# Patient Record
Sex: Female | Born: 1965 | Race: White | Hispanic: No | Marital: Married | Smoking: Never smoker
Health system: Southern US, Community
[De-identification: ages and names within clinical notes are randomized; demographics above are authoritative.]

## PROBLEM LIST (undated history)

## (undated) DIAGNOSIS — E079 Disorder of thyroid, unspecified: Secondary | ICD-10-CM

## (undated) DIAGNOSIS — C73 Malignant neoplasm of thyroid gland: Secondary | ICD-10-CM

## (undated) DIAGNOSIS — K219 Gastro-esophageal reflux disease without esophagitis: Secondary | ICD-10-CM

## (undated) HISTORY — DX: Disorder of thyroid, unspecified: E07.9

## (undated) HISTORY — PX: EYE SURGERY: SHX253

## (undated) HISTORY — DX: Malignant neoplasm of thyroid gland: C73

## (undated) HISTORY — DX: Gastro-esophageal reflux disease without esophagitis: K21.9

---

## 1986-03-29 HISTORY — PX: BREAST SURGERY: SHX581

## 2001-06-10 ENCOUNTER — Encounter: Admission: RE | Admit: 2001-06-10 | Discharge: 2001-09-08 | Payer: Self-pay | Admitting: Internal Medicine

## 2001-07-07 ENCOUNTER — Other Ambulatory Visit: Admission: RE | Admit: 2001-07-07 | Discharge: 2001-07-07 | Payer: Self-pay | Admitting: *Deleted

## 2002-10-29 HISTORY — PX: WISDOM TOOTH EXTRACTION: SHX21

## 2003-06-03 ENCOUNTER — Encounter: Payer: Self-pay | Admitting: Internal Medicine

## 2003-06-03 ENCOUNTER — Ambulatory Visit (HOSPITAL_COMMUNITY): Admission: RE | Admit: 2003-06-03 | Discharge: 2003-06-03 | Payer: Self-pay | Admitting: Internal Medicine

## 2007-03-17 ENCOUNTER — Encounter: Admission: RE | Admit: 2007-03-17 | Discharge: 2007-03-17 | Payer: Self-pay | Admitting: Obstetrics and Gynecology

## 2012-08-01 ENCOUNTER — Ambulatory Visit (INDEPENDENT_AMBULATORY_CARE_PROVIDER_SITE_OTHER): Payer: Managed Care, Other (non HMO) | Admitting: Surgery

## 2012-08-01 ENCOUNTER — Encounter (INDEPENDENT_AMBULATORY_CARE_PROVIDER_SITE_OTHER): Payer: Self-pay | Admitting: Surgery

## 2012-08-01 VITALS — BP 126/80 | HR 68 | Temp 99.0°F | Resp 18 | Ht 69.0 in | Wt 197.6 lb

## 2012-08-01 DIAGNOSIS — D1720 Benign lipomatous neoplasm of skin and subcutaneous tissue of unspecified limb: Secondary | ICD-10-CM

## 2012-08-01 DIAGNOSIS — D1779 Benign lipomatous neoplasm of other sites: Secondary | ICD-10-CM

## 2012-08-01 NOTE — Patient Instructions (Signed)
We will schedule outpatient surgery to remove the lipoma from your left thigh

## 2012-08-01 NOTE — Progress Notes (Signed)
NAMEJOIA Acevedo DOB: May 20, 1966 MRN: [id number]                                                                                      DATE: 08/01/2012  PCP: Carylon Perches, MD Referring Provider: Carylon Perches, MD  IMPRESSION:  6 cm lipoma left inner thigh, symptomatic 4 cm lipoma, asymptomatic, right supraclavicular area  PLAN:  The patient desires excision of the symptomatic thigh lipoma.since the one in in the right supra-clavicular areas asymptomatic she does not want [place] to remove this at this point in time.                 CC:  Chief Complaint  Patient presents with  . Lipoma    thigh    HPI:  Claudia Acevedo is a 46 y.o.  female who presents for evaluation of a lipoma in the left inner thigh. He has been present for about 3 years. Slowly enlarging. It is somewhat uncomfortable. She doesn't feel has a similar feeling mass in the right supraclavicular area. However this been present for a longer period, causes her no discomfort, and does not appear to be enlarging as for her she can tell.  PMH:  has no past medical history on file.  PSH:   has past surgical history that includes Breast surgery (03/1986).  ALLERGIES:  No Known Allergies  MEDICATIONS: No current outpatient prescriptions on file.  ROS: She has filled out our 12 point review of systems and it is negative. EXAM:   VITAL SIGNS: BP 126/80  Pulse 68  Temp 99 F (37.2 C) (Oral)  Resp 18  Ht 5\' 9"  (1.753 m)  Wt 197 lb 9.6 oz (89.631 kg)  BMI 29.18 kg/m2 GENERAL:  The patient is alert, oriented, and generally healthy-appearing, NAD. Mood and affect are normal.  HEENT:  The head is normocephalic, the eyes nonicteric, the pupils were round regular and equal. EOMs are normal. Pharynx normal. Dentition good.  NECK:  The neck is supple and there are no masses or thyromegaly.there is a 4 cm well-circumscribed mobile soft lipoma-type mass in the right supraclavicular area.  LUNGS: Normal respirations and clear to  auscultation.  HEART: Regular rhythm, with no murmurs rubs or gallops. Pulses are intact carotid dorsalis pedis and posterior tibial. No significant varicosities are noted.  ABDOMEN: Soft, flat, and nontender. No masses or organomegaly is noted. No hernias are noted. Bowel sounds are normal.  EXTREMITIES:  Good range of motion, no edema.there is a 6.5 x 4.5 cm soft mobile protruding mass in the left upper inner thigh. It is nontender.     Claudia Acevedo 08/01/2012  CC: Carylon Perches, MD, Carylon Perches, MD

## 2012-09-10 ENCOUNTER — Ambulatory Visit (HOSPITAL_BASED_OUTPATIENT_CLINIC_OR_DEPARTMENT_OTHER): Admission: RE | Admit: 2012-09-10 | Payer: Managed Care, Other (non HMO) | Source: Ambulatory Visit | Admitting: Surgery

## 2012-09-10 ENCOUNTER — Encounter (HOSPITAL_BASED_OUTPATIENT_CLINIC_OR_DEPARTMENT_OTHER): Admission: RE | Payer: Self-pay | Source: Ambulatory Visit

## 2012-09-10 SURGERY — EXCISION LIPOMA
Anesthesia: General | Laterality: Left

## 2012-10-01 ENCOUNTER — Encounter (INDEPENDENT_AMBULATORY_CARE_PROVIDER_SITE_OTHER): Payer: Managed Care, Other (non HMO) | Admitting: Surgery

## 2013-10-06 ENCOUNTER — Ambulatory Visit (INDEPENDENT_AMBULATORY_CARE_PROVIDER_SITE_OTHER): Payer: Managed Care, Other (non HMO) | Admitting: General Surgery

## 2013-10-06 ENCOUNTER — Encounter (INDEPENDENT_AMBULATORY_CARE_PROVIDER_SITE_OTHER): Payer: Self-pay

## 2013-10-06 ENCOUNTER — Encounter (INDEPENDENT_AMBULATORY_CARE_PROVIDER_SITE_OTHER): Payer: Self-pay | Admitting: General Surgery

## 2013-10-06 VITALS — BP 126/88 | HR 68 | Temp 97.9°F | Resp 16 | Ht 69.0 in | Wt 205.0 lb

## 2013-10-06 DIAGNOSIS — D1720 Benign lipomatous neoplasm of skin and subcutaneous tissue of unspecified limb: Secondary | ICD-10-CM

## 2013-10-06 DIAGNOSIS — D1779 Benign lipomatous neoplasm of other sites: Secondary | ICD-10-CM

## 2013-10-06 NOTE — Patient Instructions (Signed)
Plan for excision lipoma left thigh

## 2013-10-06 NOTE — Progress Notes (Signed)
Patient ID: Claudia Acevedo, female   DOB: September 23, 1966, 47 y.o.   MRN: [id number]  Chief Complaint  Patient presents with  . Lipoma    thigh    HPI Claudia Acevedo is a 47 y.o. female.  The patient is a 47 year old white female who states that she has had a lipoma on her left inner thigh for the last 5-6 years. She saw Dr. Jamey Ripa for this last year but was unable to schedule. Since that time it has not gotten much larger. It does cause her some irritation and causes her clothes not fit well.  HPI  Past Medical History  Diagnosis Date  . GERD (gastroesophageal reflux disease)     Past Surgical History  Procedure Laterality Date  . Breast surgery  03/1986    lft fib removed from left breast  . Wisdom tooth extraction  2004    Family History  Problem Relation Age of Onset  . Hyperlipidemia Mother   . Hypertension Mother   . ALS Father     Social History History  Substance Use Topics  . Smoking status: Never Smoker   . Smokeless tobacco: Not on file  . Alcohol Use: Yes     Comment: twice month    No Known Allergies  No current outpatient prescriptions on file.   No current facility-administered medications for this visit.    Review of Systems Review of Systems  Constitutional: Negative.   HENT: Negative.   Eyes: Negative.   Respiratory: Negative.   Cardiovascular: Negative.   Gastrointestinal: Negative.   Endocrine: Negative.   Genitourinary: Negative.   Musculoskeletal: Negative.   Skin: Negative.   Allergic/Immunologic: Negative.   Neurological: Negative.   Hematological: Negative.   Psychiatric/Behavioral: Negative.     Blood pressure 126/88, pulse 68, temperature 97.9 F (36.6 C), temperature source Temporal, resp. rate 16, height 5\' 9"  (1.753 m), weight 205 lb (92.987 kg).  Physical Exam Physical Exam  Constitutional: She is oriented to person, place, and time. She appears well-developed and well-nourished.  HENT:  Head: Normocephalic and atraumatic.   Eyes: Conjunctivae and EOM are normal. Pupils are equal, round, and reactive to light.  Neck: Normal range of motion. Neck supple.  There is a large lipoma in the right supraclavicular area  Cardiovascular: Normal rate, regular rhythm and normal heart sounds.   Pulmonary/Chest: Effort normal and breath sounds normal.  Abdominal: Soft. Bowel sounds are normal.  Musculoskeletal: Normal range of motion.  There is a large lipoma on the left upper inner thigh measuring approximate 7 cm across  Neurological: She is alert and oriented to person, place, and time.  Skin: Skin is warm and dry.  Psychiatric: She has a normal mood and affect. Her behavior is normal.    Data Reviewed As above  Assessment    The patient has a large lipoma on her left upper inner thigh that causes her some discomfort. Because of this I think it would be reasonable to remove it. She would also like to have this done. I discussed with her in detail the risks and benefits of the operation to remove the lipoma as well as some of the technical aspects and she understands and wishes to proceed     Plan    Plan for excision of the left thigh lipoma        TOTH III,Curt Oatis S 10/06/2013, 9:31 AM

## 2013-10-13 ENCOUNTER — Encounter (HOSPITAL_BASED_OUTPATIENT_CLINIC_OR_DEPARTMENT_OTHER): Payer: Self-pay | Admitting: *Deleted

## 2013-10-19 ENCOUNTER — Encounter (HOSPITAL_BASED_OUTPATIENT_CLINIC_OR_DEPARTMENT_OTHER): Payer: Self-pay | Admitting: *Deleted

## 2013-10-19 ENCOUNTER — Ambulatory Visit (HOSPITAL_BASED_OUTPATIENT_CLINIC_OR_DEPARTMENT_OTHER)
Admission: RE | Admit: 2013-10-19 | Discharge: 2013-10-19 | Disposition: A | Discharge status: Home / Self Care | Payer: Managed Care, Other (non HMO) | Source: Ambulatory Visit | Attending: General Surgery | Admitting: General Surgery

## 2013-10-19 ENCOUNTER — Ambulatory Visit (HOSPITAL_BASED_OUTPATIENT_CLINIC_OR_DEPARTMENT_OTHER): Payer: Managed Care, Other (non HMO) | Admitting: *Deleted

## 2013-10-19 ENCOUNTER — Encounter (HOSPITAL_BASED_OUTPATIENT_CLINIC_OR_DEPARTMENT_OTHER): Payer: Managed Care, Other (non HMO) | Admitting: *Deleted

## 2013-10-19 ENCOUNTER — Encounter (HOSPITAL_BASED_OUTPATIENT_CLINIC_OR_DEPARTMENT_OTHER)
Admission: RE | Disposition: A | Discharge status: Home / Self Care | Payer: Self-pay | Source: Ambulatory Visit | Attending: General Surgery

## 2013-10-19 DIAGNOSIS — K219 Gastro-esophageal reflux disease without esophagitis: Secondary | ICD-10-CM | POA: Insufficient documentation

## 2013-10-19 DIAGNOSIS — D1739 Benign lipomatous neoplasm of skin and subcutaneous tissue of other sites: Secondary | ICD-10-CM

## 2013-10-19 DIAGNOSIS — D1720 Benign lipomatous neoplasm of skin and subcutaneous tissue of unspecified limb: Secondary | ICD-10-CM

## 2013-10-19 DIAGNOSIS — D1779 Benign lipomatous neoplasm of other sites: Secondary | ICD-10-CM | POA: Insufficient documentation

## 2013-10-19 HISTORY — PX: LIPOMA EXCISION: SHX5283

## 2013-10-19 SURGERY — EXCISION LIPOMA
Anesthesia: General | Site: Thigh | Laterality: Left

## 2013-10-19 MED ORDER — BUPIVACAINE HCL (PF) 0.25 % IJ SOLN
INTRAMUSCULAR | Status: DC | PRN
  Administered 2013-10-19: 16 mL

## 2013-10-19 MED ORDER — MIDAZOLAM HCL 5 MG/5ML IJ SOLN
INTRAMUSCULAR | Status: DC | PRN
  Administered 2013-10-19: 2 mg via INTRAVENOUS

## 2013-10-19 MED ORDER — LIDOCAINE HCL (CARDIAC) 20 MG/ML IV SOLN
INTRAVENOUS | Status: DC | PRN
  Administered 2013-10-19: 60 mg via INTRAVENOUS

## 2013-10-19 MED ORDER — FENTANYL CITRATE 0.05 MG/ML IJ SOLN: 50.0000 ug | Freq: Once | INTRAMUSCULAR | Status: DC

## 2013-10-19 MED ORDER — OXYCODONE HCL 5 MG/5ML PO SOLN: 5.0000 mg | Freq: Once | ORAL | Status: DC | PRN

## 2013-10-19 MED ORDER — BUPIVACAINE HCL (PF) 0.25 % IJ SOLN
INTRAMUSCULAR | Status: AC
  Filled 2013-10-19: qty 30

## 2013-10-19 MED ORDER — MIDAZOLAM HCL 2 MG/2ML IJ SOLN: 1.0000 mg | INTRAMUSCULAR | Status: DC | PRN

## 2013-10-19 MED ORDER — FENTANYL CITRATE 0.05 MG/ML IJ SOLN
INTRAMUSCULAR | Status: DC | PRN
  Administered 2013-10-19: 100 ug via INTRAVENOUS

## 2013-10-19 MED ORDER — PROMETHAZINE HCL 25 MG/ML IJ SOLN: 6.2500 mg | INTRAMUSCULAR | Status: DC | PRN

## 2013-10-19 MED ORDER — OXYCODONE HCL 5 MG PO TABS: 5.0000 mg | ORAL_TABLET | Freq: Once | ORAL | Status: DC | PRN

## 2013-10-19 MED ORDER — MIDAZOLAM HCL 2 MG/2ML IJ SOLN
INTRAMUSCULAR | Status: AC
  Filled 2013-10-19: qty 2

## 2013-10-19 MED ORDER — BUPIVACAINE-EPINEPHRINE PF 0.25-1:200000 % IJ SOLN
INTRAMUSCULAR | Status: AC
  Filled 2013-10-19: qty 30

## 2013-10-19 MED ORDER — HYDROCODONE-IBUPROFEN 5-200 MG PO TABS: 1.0000 | ORAL_TABLET | Freq: Three times a day (TID) | ORAL | Status: DC | PRN

## 2013-10-19 MED ORDER — SUCCINYLCHOLINE CHLORIDE 20 MG/ML IJ SOLN
INTRAMUSCULAR | Status: AC
  Filled 2013-10-19: qty 1

## 2013-10-19 MED ORDER — FENTANYL CITRATE 0.05 MG/ML IJ SOLN
INTRAMUSCULAR | Status: AC
  Filled 2013-10-19: qty 4

## 2013-10-19 MED ORDER — PROPOFOL 10 MG/ML IV BOLUS
INTRAVENOUS | Status: DC | PRN
  Administered 2013-10-19: 180 mg via INTRAVENOUS

## 2013-10-19 MED ORDER — HYDROMORPHONE HCL PF 1 MG/ML IJ SOLN: 0.2500 mg | INTRAMUSCULAR | Status: DC | PRN

## 2013-10-19 MED ORDER — FENTANYL CITRATE 0.05 MG/ML IJ SOLN: 50.0000 ug | INTRAMUSCULAR | Status: DC | PRN

## 2013-10-19 MED ORDER — LACTATED RINGERS IV SOLN
INTRAVENOUS | Status: DC
  Administered 2013-10-19 (×2): via INTRAVENOUS

## 2013-10-19 MED ORDER — DEXAMETHASONE SODIUM PHOSPHATE 4 MG/ML IJ SOLN
INTRAMUSCULAR | Status: DC | PRN
  Administered 2013-10-19: 10 mg via INTRAVENOUS

## 2013-10-19 MED ORDER — PROPOFOL 10 MG/ML IV EMUL
INTRAVENOUS | Status: AC
  Filled 2013-10-19: qty 50

## 2013-10-19 MED ORDER — CEFAZOLIN SODIUM-DEXTROSE 2-3 GM-% IV SOLR
INTRAVENOUS | Status: DC | PRN
  Administered 2013-10-19: 2 g via INTRAVENOUS

## 2013-10-19 MED ORDER — ONDANSETRON HCL 4 MG/2ML IJ SOLN
INTRAMUSCULAR | Status: DC | PRN
  Administered 2013-10-19: 4 mg via INTRAVENOUS

## 2013-10-19 SURGICAL SUPPLY — 51 items
ADH SKN CLS APL DERMABOND .7 (GAUZE/BANDAGES/DRESSINGS) ×1
BLADE SURG 10 STRL SS (BLADE) ×1 IMPLANT
BLADE SURG 15 STRL LF DISP TIS (BLADE) ×1 IMPLANT
BLADE SURG 15 STRL SS (BLADE) ×2
CANISTER SUCT 1200ML W/VALVE (MISCELLANEOUS) IMPLANT
CHLORAPREP W/TINT 26ML (MISCELLANEOUS) ×2 IMPLANT
COVER MAYO STAND STRL (DRAPES) ×2 IMPLANT
COVER TABLE BACK 60X90 (DRAPES) ×2 IMPLANT
DECANTER SPIKE VIAL GLASS SM (MISCELLANEOUS) IMPLANT
DERMABOND ADVANCED (GAUZE/BANDAGES/DRESSINGS) ×1
DERMABOND ADVANCED .7 DNX12 (GAUZE/BANDAGES/DRESSINGS) ×1 IMPLANT
DRAPE PED LAPAROTOMY (DRAPES) ×2 IMPLANT
DRAPE UTILITY XL STRL (DRAPES) ×2 IMPLANT
ELECT COATED BLADE 2.86 ST (ELECTRODE) ×2 IMPLANT
ELECT REM PT RETURN 9FT ADLT (ELECTROSURGICAL) ×2
ELECTRODE REM PT RTRN 9FT ADLT (ELECTROSURGICAL) ×1 IMPLANT
GAUZE SPONGE 4X4 16PLY XRAY LF (GAUZE/BANDAGES/DRESSINGS) IMPLANT
GLOVE BIO SURGEON STRL SZ7.5 (GLOVE) ×2 IMPLANT
GLOVE BIOGEL PI IND STRL 7.0 (GLOVE) IMPLANT
GLOVE BIOGEL PI INDICATOR 7.0 (GLOVE) ×1
GLOVE ECLIPSE 6.5 STRL STRAW (GLOVE) ×1 IMPLANT
GOWN PREVENTION PLUS XLARGE (GOWN DISPOSABLE) ×4 IMPLANT
NDL HYPO 25X1 1.5 SAFETY (NEEDLE) IMPLANT
NEEDLE HYPO 25X1 1.5 SAFETY (NEEDLE) ×2 IMPLANT
NS IRRIG 1000ML POUR BTL (IV SOLUTION) ×2 IMPLANT
PACK BASIN DAY SURGERY FS (CUSTOM PROCEDURE TRAY) ×2 IMPLANT
PENCIL BUTTON HOLSTER BLD 10FT (ELECTRODE) ×2 IMPLANT
SLEEVE SCD COMPRESS KNEE MED (MISCELLANEOUS) ×2 IMPLANT
SPONGE GAUZE 4X4 12PLY (GAUZE/BANDAGES/DRESSINGS) ×1 IMPLANT
SPONGE GAUZE 4X4 12PLY STER LF (GAUZE/BANDAGES/DRESSINGS) ×1 IMPLANT
SPONGE LAP 18X18 X RAY DECT (DISPOSABLE) ×1 IMPLANT
SUT CHROMIC 3 0 SH 27 (SUTURE) IMPLANT
SUT ETHILON 3 0 PS 1 (SUTURE) IMPLANT
SUT MNCRL AB 4-0 PS2 18 (SUTURE) ×1 IMPLANT
SUT MON AB 4-0 PC3 18 (SUTURE) IMPLANT
SUT PROLENE 3 0 PS 2 (SUTURE) IMPLANT
SUT SILK 2 0 FS (SUTURE) IMPLANT
SUT VIC AB 3-0 54X BRD REEL (SUTURE) IMPLANT
SUT VIC AB 3-0 BRD 54 (SUTURE)
SUT VIC AB 3-0 FS2 27 (SUTURE) IMPLANT
SUT VIC AB 3-0 SH 27 (SUTURE)
SUT VIC AB 3-0 SH 27X BRD (SUTURE) IMPLANT
SUT VIC AB 4-0 RB1 27 (SUTURE)
SUT VIC AB 4-0 RB1 27X BRD (SUTURE) IMPLANT
SUT VICRYL 3-0 CR8 SH (SUTURE) ×1 IMPLANT
SUT VICRYL AB 3 0 TIES (SUTURE) IMPLANT
SYR CONTROL 10ML LL (SYRINGE) ×1 IMPLANT
TOWEL OR 17X24 6PK STRL BLUE (TOWEL DISPOSABLE) ×3 IMPLANT
TOWEL OR NON WOVEN STRL DISP B (DISPOSABLE) ×1 IMPLANT
TUBE CONNECTING 20X1/4 (TUBING) IMPLANT
YANKAUER SUCT BULB TIP NO VENT (SUCTIONS) IMPLANT

## 2013-10-19 NOTE — Op Note (Signed)
10/19/2013  9:46 AM  PATIENT:  Claudia Acevedo  47 y.o. female  PRE-OPERATIVE DIAGNOSIS:  lipoma left thigh  POST-OPERATIVE DIAGNOSIS:  lipoma left thigh, 11 X 6cm  PROCEDURE:  Procedure(s): EXCISION THIGH LIPOMA (Left)  SURGEON:  Surgeon(s) and Role:    * Robyne Askew, MD - Primary  PHYSICIAN ASSISTANT:   ASSISTANTS: none   ANESTHESIA:   general  EBL:  Total I/O In: 1250 [I.V.:1250] Out: -   BLOOD ADMINISTERED:none  DRAINS: none   LOCAL MEDICATIONS USED:  MARCAINE     SPECIMEN:  Source of Specimen:  lipoma left thigh  DISPOSITION OF SPECIMEN:  PATHOLOGY  COUNTS:  YES  TOURNIQUET:  * No tourniquets in log *  DICTATION: .Dragon Dictation After informed consent was obtained the patient was brought to the operating room placed in the supine position on the operating table. After adequate induction of general anesthesia the patient was placed in a frog-leg type position and her left upper inner thigh was prepped with ChloraPrep, allowed to dry, and draped in usual sterile manner. The patient had a large lipoma in her inner upper left thigh. An elliptical incision was made around the skin overlying the lipoma. This incision was carried through the skin and subcutaneous tissue sharply with the electrocautery. The lipoma was easily separated from the rest of the subcutaneous tissue by blunt finger dissection as well as sharp dissection with the electrocautery. All finger projections off of the main body of the lipoma were also removed in a similar manner. Once the lipoma was removed it measured 11 x 6 cm. Hemostasis was achieved using the Bovie electrocautery. The wound was infiltrated with quarter percent Marcaine and irrigated with copious amounts of saline. The deep layer the wound was then closed with interrupted 3-0 Vicryl stitches. The skin was then closed with interrupted 4-0 Monocryl subcuticular stitches. Dermabond dressings and sterile dressings were applied. The patient  tolerated the procedure well. At the end of the case all needle sponge and instrument counts were correct. The patient was then awakened and taken to recovery in stable condition.  PLAN OF CARE: Discharge to home after PACU  PATIENT DISPOSITION:  PACU - hemodynamically stable.   Delay start of Pharmacological VTE agent (>24hrs) due to surgical blood loss or risk of bleeding: not applicable

## 2013-10-19 NOTE — Interval H&P Note (Signed)
History and Physical Interval Note:  10/19/2013 8:28 AM  Claudia Acevedo  has presented today for surgery, with the diagnosis of lipoma  The various methods of treatment have been discussed with the patient and family. After consideration of risks, benefits and other options for treatment, the patient has consented to  Procedure(s): EXCISION THIGH LIPOMA (Left) as a surgical intervention .  The patient's history has been reviewed, patient examined, no change in status, stable for surgery.  I have reviewed the patient's chart and labs.  Questions were answered to the patient's satisfaction.     TOTH III,Samreen Seltzer S

## 2013-10-19 NOTE — H&P (View-Only) (Signed)
Patient ID: Claudia Acevedo, female   DOB: 04/27/1966, 47 y.o.   MRN: [id number]  Chief Complaint  Patient presents with  . Lipoma    thigh    HPI Claudia Acevedo is a 47 y.o. female.  The patient is a 47-year-old white female who states that she has had a lipoma on her left inner thigh for the last 5-6 years. She saw Dr. Streck for this last year but was unable to schedule. Since that time it has not gotten much larger. It does cause her some irritation and causes her clothes not fit well.  HPI  Past Medical History  Diagnosis Date  . GERD (gastroesophageal reflux disease)     Past Surgical History  Procedure Laterality Date  . Breast surgery  03/1986    lft fib removed from left breast  . Wisdom tooth extraction  2004    Family History  Problem Relation Age of Onset  . Hyperlipidemia Mother   . Hypertension Mother   . ALS Father     Social History History  Substance Use Topics  . Smoking status: Never Smoker   . Smokeless tobacco: Not on file  . Alcohol Use: Yes     Comment: twice month    No Known Allergies  No current outpatient prescriptions on file.   No current facility-administered medications for this visit.    Review of Systems Review of Systems  Constitutional: Negative.   HENT: Negative.   Eyes: Negative.   Respiratory: Negative.   Cardiovascular: Negative.   Gastrointestinal: Negative.   Endocrine: Negative.   Genitourinary: Negative.   Musculoskeletal: Negative.   Skin: Negative.   Allergic/Immunologic: Negative.   Neurological: Negative.   Hematological: Negative.   Psychiatric/Behavioral: Negative.     Blood pressure 126/88, pulse 68, temperature 97.9 F (36.6 C), temperature source Temporal, resp. rate 16, height 5' 9" (1.753 m), weight 205 lb (92.987 kg).  Physical Exam Physical Exam  Constitutional: She is oriented to person, place, and time. She appears well-developed and well-nourished.  HENT:  Head: Normocephalic and atraumatic.   Eyes: Conjunctivae and EOM are normal. Pupils are equal, round, and reactive to light.  Neck: Normal range of motion. Neck supple.  There is a large lipoma in the right supraclavicular area  Cardiovascular: Normal rate, regular rhythm and normal heart sounds.   Pulmonary/Chest: Effort normal and breath sounds normal.  Abdominal: Soft. Bowel sounds are normal.  Musculoskeletal: Normal range of motion.  There is a large lipoma on the left upper inner thigh measuring approximate 7 cm across  Neurological: She is alert and oriented to person, place, and time.  Skin: Skin is warm and dry.  Psychiatric: She has a normal mood and affect. Her behavior is normal.    Data Reviewed As above  Assessment    The patient has a large lipoma on her left upper inner thigh that causes her some discomfort. Because of this I think it would be reasonable to remove it. She would also like to have this done. I discussed with her in detail the risks and benefits of the operation to remove the lipoma as well as some of the technical aspects and she understands and wishes to proceed     Plan    Plan for excision of the left thigh lipoma        TOTH III,PAUL S 10/06/2013, 9:31 AM    

## 2013-10-19 NOTE — Transfer of Care (Signed)
Immediate Anesthesia Transfer of Care Note  Patient: Claudia Acevedo  Procedure(s) Performed: Procedure(s): EXCISION THIGH LIPOMA (Left)  Patient Location: PACU  Anesthesia Type:General  Level of Consciousness: awake, alert  and oriented  Airway & Oxygen Therapy: Patient Spontanous Breathing and Patient connected to face mask oxygen  Post-op Assessment: Report given to PACU RN, Post -op Vital signs reviewed and stable and Patient moving all extremities  Post vital signs: Reviewed and stable  Complications: No apparent anesthesia complications

## 2013-10-19 NOTE — Anesthesia Procedure Notes (Signed)
Procedure Name: LMA Insertion Date/Time: 10/19/2013 9:04 AM Performed by: Robyne Askew Pre-anesthesia Checklist: Patient identified, Emergency Drugs available, Suction available and Patient being monitored Patient Re-evaluated:Patient Re-evaluated prior to inductionOxygen Delivery Method: Circle System Utilized Preoxygenation: Pre-oxygenation with 100% oxygen Intubation Type: IV induction Ventilation: Mask ventilation without difficulty LMA: LMA inserted LMA Size: 4.0 Number of attempts: 1 Airway Equipment and Method: bite block Placement Confirmation: positive ETCO2 and breath sounds checked- equal and bilateral Tube secured with: Tape Dental Injury: Teeth and Oropharynx as per pre-operative assessment

## 2013-10-19 NOTE — Anesthesia Preprocedure Evaluation (Signed)
Anesthesia Evaluation  Patient identified by MRN, date of birth, ID band Patient awake    Reviewed: Allergy & Precautions, H&P , NPO status , Patient's Chart, lab work & pertinent test results  Airway Mallampati: I TM Distance: >3 FB Neck ROM: Full    Dental   Pulmonary          Cardiovascular Rhythm:Regular Rate:Normal     Neuro/Psych    GI/Hepatic GERD-  ,  Endo/Other    Renal/GU      Musculoskeletal   Abdominal (+) + obese,   Peds  Hematology   Anesthesia Other Findings   Reproductive/Obstetrics                           Anesthesia Physical Anesthesia Plan  ASA: I  Anesthesia Plan: General   Post-op Pain Management:    Induction: Intravenous  Airway Management Planned: LMA  Additional Equipment:   Intra-op Plan:   Post-operative Plan: Extubation in OR  Informed Consent: I have reviewed the patients History and Physical, chart, labs and discussed the procedure including the risks, benefits and alternatives for the proposed anesthesia with the patient or authorized representative who has indicated his/her understanding and acceptance.     Plan Discussed with: CRNA and Surgeon  Anesthesia Plan Comments:         Anesthesia Quick Evaluation

## 2013-10-19 NOTE — Anesthesia Postprocedure Evaluation (Signed)
  Anesthesia Post-op Note  Patient: Claudia Acevedo  Procedure(s) Performed: Procedure(s): EXCISION THIGH LIPOMA (Left)  Patient Location: PACU  Anesthesia Type:General  Level of Consciousness: awake and alert   Airway and Oxygen Therapy: Patient Spontanous Breathing  Post-op Pain: mild  Post-op Assessment: Post-op Vital signs reviewed, Patient's Cardiovascular Status Stable, Respiratory Function Stable, Patent Airway, No signs of Nausea or vomiting and Pain level controlled  Post-op Vital Signs: Reviewed and stable  Complications: No apparent anesthesia complications

## 2013-10-20 ENCOUNTER — Encounter (HOSPITAL_BASED_OUTPATIENT_CLINIC_OR_DEPARTMENT_OTHER): Payer: Self-pay | Admitting: General Surgery

## 2013-10-26 ENCOUNTER — Telehealth (INDEPENDENT_AMBULATORY_CARE_PROVIDER_SITE_OTHER): Payer: Self-pay

## 2013-10-26 NOTE — Telephone Encounter (Signed)
LMOM> benign lipoma

## 2013-10-26 NOTE — Telephone Encounter (Signed)
Message copied by Brennan Bailey on Mon Oct 26, 2013 10:55 AM ------      Message from: Caleen Essex III      Created: Fri Oct 23, 2013  9:45 AM       Benign lipoma       ------

## 2013-11-09 ENCOUNTER — Ambulatory Visit (INDEPENDENT_AMBULATORY_CARE_PROVIDER_SITE_OTHER): Payer: Managed Care, Other (non HMO) | Admitting: General Surgery

## 2013-11-09 ENCOUNTER — Encounter (INDEPENDENT_AMBULATORY_CARE_PROVIDER_SITE_OTHER): Payer: Self-pay | Admitting: General Surgery

## 2013-11-09 VITALS — BP 138/98 | HR 69 | Temp 97.9°F | Resp 16 | Ht 69.0 in | Wt 205.8 lb

## 2013-11-09 DIAGNOSIS — D1720 Benign lipomatous neoplasm of skin and subcutaneous tissue of unspecified limb: Secondary | ICD-10-CM

## 2013-11-09 DIAGNOSIS — D1779 Benign lipomatous neoplasm of other sites: Secondary | ICD-10-CM

## 2013-11-09 NOTE — Patient Instructions (Signed)
May return to all normal activities 

## 2013-11-09 NOTE — Progress Notes (Signed)
Subjective:     Patient ID: Claudia Acevedo, female   DOB: November 18, 1965, 48 y.o.   MRN: [id number]  HPI The patient is a 48 year old white female who is 3 weeks status post excision of a large lipoma from her left upper inner thigh. She's done very well. She denies any pain. She is back bowling and doing well her normal activities.  Review of Systems     Objective:   Physical Exam On exam her left thigh incision is healing nicely with no sign of infection or significant seroma    Assessment:     The patient is 3 weeks status post excision of a large lipoma from the left thigh     Plan:     At this point she may return to all her normal activities without restrictions. We will plan to see her back on a when necessary basis

## 2014-07-07 ENCOUNTER — Other Ambulatory Visit: Payer: Self-pay | Admitting: Obstetrics and Gynecology

## 2014-07-08 LAB — CYTOLOGY - PAP

## 2018-11-06 ENCOUNTER — Other Ambulatory Visit: Payer: Self-pay

## 2018-11-06 ENCOUNTER — Emergency Department (HOSPITAL_COMMUNITY)
Admission: EM | Admit: 2018-11-06 | Discharge: 2018-11-06 | Disposition: A | Discharge status: Home / Self Care | Payer: BC Managed Care – PPO | Attending: Emergency Medicine | Admitting: Emergency Medicine

## 2018-11-06 ENCOUNTER — Emergency Department (HOSPITAL_COMMUNITY): Payer: BC Managed Care – PPO

## 2018-11-06 ENCOUNTER — Encounter (HOSPITAL_COMMUNITY): Payer: Self-pay | Admitting: *Deleted

## 2018-11-06 DIAGNOSIS — R079 Chest pain, unspecified: Secondary | ICD-10-CM

## 2018-11-06 LAB — BASIC METABOLIC PANEL
Anion gap: 6 (ref 5–15)
BUN: 19 mg/dL (ref 6–20)
CALCIUM: 9.2 mg/dL (ref 8.9–10.3)
CO2: 26 mmol/L (ref 22–32)
CREATININE: 0.85 mg/dL (ref 0.44–1.00)
Chloride: 105 mmol/L (ref 98–111)
GFR calc Af Amer: >60 mL/min (ref >60)
Glucose, Bld: 95 mg/dL (ref 70–99)
Potassium: 3.8 mmol/L (ref 3.5–5.1)
Sodium: 137 mmol/L (ref 135–145)

## 2018-11-06 LAB — CBC
HEMATOCRIT: 45.3 % (ref 36.0–46.0)
Hemoglobin: 14.5 g/dL (ref 12.0–15.0)
MCH: 28.2 pg (ref 26.0–34.0)
MCHC: 32 g/dL (ref 30.0–36.0)
MCV: 88.1 fL (ref 80.0–100.0)
NRBC: 0 % (ref 0.0–0.2)
PLATELETS: 294 10*3/uL (ref 150–400)
RBC: 5.14 MIL/uL — ABNORMAL HIGH (ref 3.87–5.11)
RDW: 13.1 % (ref 11.5–15.5)
WBC: 6.9 10*3/uL (ref 4.0–10.5)

## 2018-11-06 LAB — I-STAT BETA HCG BLOOD, ED (NOT ORDERABLE): I-stat hCG, quantitative: <5 m[IU]/mL (ref <5)

## 2018-11-06 LAB — POCT I-STAT TROPONIN I: TROPONIN I, POC: 0 ng/mL (ref 0.00–0.08)

## 2018-11-06 NOTE — ED Notes (Signed)
ED Provider at bedside. 

## 2018-11-06 NOTE — ED Provider Notes (Signed)
[facility] EMERGENCY DEPARTMENT Provider Note   CSN: [id number] Arrival date & time: 11/06/18  0125     History   Chief Complaint Chief Complaint  Patient presents with  . Chest Pain    HPI Claudia Acevedo is a 53 y.o. female.  Patient is a 53 year old female with history of GERD and lumpectomy.  She presents today with complaints of chest discomfort.  She has had what she describes as reflux for the past 2 weeks.  Over the past several days she is now noticing discomfort in her chest.  This occurs intermittently and seems to migrate from the center of her chest to the sides.  She is also experienced discomfort in her upper back.  These episodes last for approximately 15 to 20 minutes, then seem to resolve spontaneously.  These episodes are not associated with exertion and there is no shortness of breath, nausea, diaphoresis, or radiation to the arm or jaw with these symptoms.  She denies any prior cardiac history.  She has had high cholesterol in the past, however this is not been treated with medication.  She has no other cardiac risk factors.  The history is provided by the patient.  Chest Pain  Chest pain location: Migratory. Pain quality: pressure   Pain radiates to:  Does not radiate Pain severity:  Moderate Timing:  Intermittent Progression:  Worsening Chronicity:  New Relieved by:  Nothing Worsened by:  Nothing Ineffective treatments:  None tried   Past Medical History:  Diagnosis Date  . GERD (gastroesophageal reflux disease)     Patient Active Problem List   Diagnosis Date Noted  . Lipoma of left inner thigh 08/01/2012    Past Surgical History:  Procedure Laterality Date  . BREAST SURGERY  03/1986   lft fib removed from left breast  . LIPOMA EXCISION Left 10/19/2013   Procedure: EXCISION THIGH LIPOMA;  Surgeon: Merrie Roof, MD;  Location: [place];  Service: General;  Laterality: Left;  . WISDOM TOOTH EXTRACTION  2004     OB History     No obstetric history on file.      Home Medications    Prior to Admission medications   Not on File    Family History Family History  Problem Relation Age of Onset  . Hyperlipidemia Mother   . Hypertension Mother   . ALS Father     Social History Social History   Tobacco Use  . Smoking status: Never Smoker  . Smokeless tobacco: Never Used  Substance Use Topics  . Alcohol use: Yes    Comment: twice month  . Drug use: No     Allergies   Patient has no known allergies.   Review of Systems Review of Systems  Cardiovascular: Positive for chest pain.  All other systems reviewed and are negative.    Physical Exam Updated Vital Signs BP (!) 139/94 (BP Location: Left Arm)   Pulse 68   Resp 14   Ht 5' 8.5" (1.74 m)   Wt 95.3 kg   SpO2 100%   BMI 31.47 kg/m   Physical Exam Vitals signs and nursing note reviewed.  Constitutional:      General: She is not in acute distress.    Appearance: She is well-developed. She is not diaphoretic.  HENT:     Head: Normocephalic and atraumatic.  Neck:     Musculoskeletal: Normal range of motion and neck supple.  Cardiovascular:     Rate and Rhythm: Normal  rate and regular rhythm.     Heart sounds: No murmur. No friction rub. No gallop.   Pulmonary:     Effort: Pulmonary effort is normal. No respiratory distress.     Breath sounds: Normal breath sounds. No wheezing.  Abdominal:     General: Bowel sounds are normal. There is no distension.     Palpations: Abdomen is soft.     Tenderness: There is no abdominal tenderness.  Musculoskeletal: Normal range of motion.     Right lower leg: She exhibits no tenderness. No edema.     Left lower leg: She exhibits no tenderness. No edema.  Skin:    General: Skin is warm and dry.  Neurological:     Mental Status: She is alert and oriented to person, place, and time.      ED Treatments / Results  Labs (all labs ordered are listed, but only abnormal results are  displayed) Labs Reviewed  BASIC METABOLIC PANEL  CBC  I-STAT TROPONIN, ED  I-STAT BETA HCG BLOOD, ED ([facility], [facility], [facility] ONLY)    EKG EKG Interpretation  Date/Time:  Thursday November 06 2018 02:08:11 EST Ventricular Rate:  69 PR Interval:  142 QRS Duration: 88 QT Interval:  392 QTC Calculation: 420 R Axis:   71 Text Interpretation:  Normal sinus rhythm Nonspecific ST abnormality Abnormal ECG Confirmed by Veryl Speak [phone]) on 11/06/2018 2:12:52 AM   Radiology Dg Chest 2 View  Result Date: 11/06/2018 CLINICAL DATA:  Chest pain and cough EXAM: CHEST - 2 VIEW COMPARISON:  None. FINDINGS: The heart size and mediastinal contours are within normal limits. Both lungs are clear. The visualized skeletal structures are unremarkable. IMPRESSION: No active cardiopulmonary disease. Electronically Signed   By: Ashley Royalty M.D.   On: 11/06/2018 02:35    Procedures Procedures (including critical care time)  Medications Ordered in ED Medications - No data to display   Initial Impression / Assessment and Plan / ED Course  I have reviewed the triage vital signs and the nursing notes.  Pertinent labs & imaging results that were available during my care of the patient were reviewed by me and considered in my medical decision making (see chart for details).  Patient is a 53 year old female with no significant past medical history.  She presents today with complaints of chest discomfort.  This is been migratory throughout her chest for the past several days and occurs episodically with no inciting factors.  Her work-up thus far is unremarkable.  Her troponin is negative and EKG shows no specific changes.  At this point, I see no indication for admission.  I will have the patient follow-up with cardiology if her symptoms or not improving in the next few days.  Final Clinical Impressions(s) / ED Diagnoses   Final diagnoses:  None    ED Discharge Orders    None       Veryl Speak, MD 11/06/18  [phone]

## 2018-11-06 NOTE — ED Triage Notes (Signed)
Pt c/o chest pain x 3 days that radiates to her back; pt states the pain is intermittent; pt describes the pain as an aching and tightness

## 2018-11-06 NOTE — Discharge Instructions (Addendum)
Ibuprofen 600 mg every 6 hours as needed for pain.  Follow-up with cardiology if your symptoms continue.  The contact information for the cardiology clinic in [place] has been provided in this discharge summary for you to call and make these arrangements.  Return to the emergency department in the meantime if your symptoms significantly worsen or change.

## 2019-11-16 ENCOUNTER — Other Ambulatory Visit: Payer: Self-pay

## 2019-11-16 ENCOUNTER — Ambulatory Visit: Payer: BC Managed Care – PPO | Attending: Internal Medicine

## 2019-11-16 DIAGNOSIS — Z20822 Contact with and (suspected) exposure to covid-19: Secondary | ICD-10-CM

## 2019-11-17 LAB — NOVEL CORONAVIRUS, NAA: SARS-CoV-2, NAA: NOT DETECTED

## 2020-10-29 DIAGNOSIS — E785 Hyperlipidemia, unspecified: Secondary | ICD-10-CM

## 2020-10-29 HISTORY — DX: Hyperlipidemia, unspecified: E78.5

## 2021-01-10 ENCOUNTER — Encounter: Payer: Self-pay | Admitting: Plastic Surgery

## 2021-01-10 ENCOUNTER — Other Ambulatory Visit: Payer: Self-pay

## 2021-01-10 ENCOUNTER — Ambulatory Visit: Payer: BC Managed Care – PPO | Admitting: Plastic Surgery

## 2021-01-10 DIAGNOSIS — D170 Benign lipomatous neoplasm of skin and subcutaneous tissue of head, face and neck: Secondary | ICD-10-CM

## 2021-01-10 DIAGNOSIS — R221 Localized swelling, mass and lump, neck: Secondary | ICD-10-CM | POA: Diagnosis not present

## 2021-01-10 NOTE — Progress Notes (Signed)
Patient ID: Claudia Acevedo, female    DOB: [birth year], 55 y.o.   MRN: [id number]   Chief Complaint  Patient presents with  . Advice Only  . Skin Problem    The patient is a 55 year old female here for evaluation of a mass on her neck.  The patient states she has noticed it for the past 10 years.  It seems to be getting larger.  She is now not able to hide it with her close.  She had a similar mass removed from her thigh.  That came back as a lipoma.  The area is about 6 x 7 cm.  It feels somewhat mobile and soft.  She seems to have a little bit of asymmetry of her neck which is concerning.  I can also see her jugular movement on the right side compared to the left side.  There is no change in pulse straight of the radius on either side.  She is otherwise in good health and is not a smoker and does not have diabetes.   Review of Systems  Constitutional: Negative.  Negative for activity change.  HENT: Negative.   Eyes: Negative.   Respiratory: Negative.  Negative for chest tightness and shortness of breath.   Gastrointestinal: Negative.  Negative for abdominal pain.  Endocrine: Negative.   Genitourinary: Negative.   Musculoskeletal: Negative for back pain.  Neurological: Negative.   Hematological: Negative.   Psychiatric/Behavioral: Negative.     Past Medical History:  Diagnosis Date  . GERD (gastroesophageal reflux disease)     Past Surgical History:  Procedure Laterality Date  . BREAST SURGERY  03/1986   lft fib removed from left breast  . LIPOMA EXCISION Left 10/19/2013   Procedure: EXCISION THIGH LIPOMA;  Surgeon: Merrie Roof, MD;  Location: [facility] [facility];  Service: General;  Laterality: Left;  . WISDOM TOOTH EXTRACTION  2004      Current Outpatient Medications:  .  atorvastatin (LIPITOR) 20 MG tablet, Take 20 mg by mouth at bedtime., Disp: , Rfl:    Objective:   Vitals:   01/10/21 0853  BP: 134/86  Pulse: 84  SpO2: 99%    Physical Exam Vitals  and nursing note reviewed.  Constitutional:      Appearance: Normal appearance.  HENT:     Head: Normocephalic and atraumatic.  Cardiovascular:     Rate and Rhythm: Normal rate.     Pulses: Normal pulses.  Pulmonary:     Effort: Pulmonary effort is normal. No respiratory distress.     Breath sounds: No wheezing.  Abdominal:     General: Abdomen is flat. There is no distension.     Tenderness: There is no abdominal tenderness.  Musculoskeletal:        General: Swelling and deformity present. No tenderness or signs of injury.  Skin:    General: Skin is warm.     Capillary Refill: Capillary refill takes less than 2 seconds.     Coloration: Skin is not jaundiced.     Findings: No bruising.  Neurological:     General: No focal deficit present.     Mental Status: She is alert and oriented to person, place, and time.  Psychiatric:        Mood and Affect: Mood normal.        Behavior: Behavior normal.        Thought Content: Thought content normal.     Assessment & Plan:  Lipoma of  neck  Mass of neck  Recommend excision of mass of neck.  I think that getting a CT scan prior to the surgery is warranted due to the asymmetry in the location.  [place], DO

## 2021-01-26 ENCOUNTER — Ambulatory Visit (HOSPITAL_BASED_OUTPATIENT_CLINIC_OR_DEPARTMENT_OTHER): Payer: BC Managed Care – PPO

## 2021-01-31 ENCOUNTER — Telehealth (INDEPENDENT_AMBULATORY_CARE_PROVIDER_SITE_OTHER): Payer: BC Managed Care – PPO | Admitting: Plastic Surgery

## 2021-01-31 ENCOUNTER — Other Ambulatory Visit: Payer: Self-pay

## 2021-01-31 ENCOUNTER — Ambulatory Visit (HOSPITAL_BASED_OUTPATIENT_CLINIC_OR_DEPARTMENT_OTHER)
Admission: RE | Admit: 2021-01-31 | Discharge: 2021-01-31 | Disposition: A | Discharge status: Home / Self Care | Payer: BC Managed Care – PPO | Source: Ambulatory Visit | Attending: Plastic Surgery | Admitting: Plastic Surgery

## 2021-01-31 DIAGNOSIS — R221 Localized swelling, mass and lump, neck: Secondary | ICD-10-CM | POA: Diagnosis not present

## 2021-01-31 MED ORDER — IOHEXOL 300 MG/ML  SOLN
100.0000 mL | Freq: Once | INTRAMUSCULAR | Status: AC | PRN
  Administered 2021-01-31: 100 mL via INTRAVENOUS

## 2021-02-08 ENCOUNTER — Other Ambulatory Visit (HOSPITAL_COMMUNITY): Payer: Self-pay | Admitting: Internal Medicine

## 2021-02-08 ENCOUNTER — Other Ambulatory Visit: Payer: Self-pay | Admitting: Internal Medicine

## 2021-02-08 DIAGNOSIS — L040 Acute lymphadenitis of face, head and neck: Secondary | ICD-10-CM

## 2021-02-13 ENCOUNTER — Ambulatory Visit (HOSPITAL_COMMUNITY): Payer: BC Managed Care – PPO

## 2021-02-16 ENCOUNTER — Ambulatory Visit (HOSPITAL_COMMUNITY)
Admission: RE | Admit: 2021-02-16 | Discharge: 2021-02-16 | Disposition: A | Discharge status: Home / Self Care | Payer: BC Managed Care – PPO | Source: Ambulatory Visit | Attending: Internal Medicine | Admitting: Internal Medicine

## 2021-02-16 ENCOUNTER — Other Ambulatory Visit: Payer: Self-pay

## 2021-02-16 DIAGNOSIS — L040 Acute lymphadenitis of face, head and neck: Secondary | ICD-10-CM | POA: Diagnosis present

## 2021-02-17 ENCOUNTER — Other Ambulatory Visit: Payer: Self-pay | Admitting: Internal Medicine

## 2021-02-17 DIAGNOSIS — E041 Nontoxic single thyroid nodule: Secondary | ICD-10-CM

## 2021-02-22 ENCOUNTER — Other Ambulatory Visit: Payer: Self-pay

## 2021-02-22 ENCOUNTER — Ambulatory Visit
Admission: RE | Admit: 2021-02-22 | Discharge: 2021-02-22 | Disposition: A | Discharge status: Home / Self Care | Payer: BC Managed Care – PPO | Source: Ambulatory Visit | Attending: Internal Medicine | Admitting: Internal Medicine

## 2021-02-22 ENCOUNTER — Other Ambulatory Visit: Payer: Self-pay | Admitting: Internal Medicine

## 2021-02-22 DIAGNOSIS — E041 Nontoxic single thyroid nodule: Secondary | ICD-10-CM

## 2021-02-22 DIAGNOSIS — D4989 Neoplasm of unspecified behavior of other specified sites: Secondary | ICD-10-CM

## 2021-02-27 ENCOUNTER — Other Ambulatory Visit: Payer: Self-pay | Admitting: Radiology

## 2021-02-28 ENCOUNTER — Ambulatory Visit (HOSPITAL_COMMUNITY)
Admission: RE | Admit: 2021-02-28 | Discharge: 2021-02-28 | Disposition: A | Discharge status: Home / Self Care | Payer: BC Managed Care – PPO | Source: Ambulatory Visit | Attending: Interventional Radiology | Admitting: Interventional Radiology

## 2021-02-28 ENCOUNTER — Other Ambulatory Visit: Payer: Self-pay

## 2021-02-28 ENCOUNTER — Encounter (HOSPITAL_COMMUNITY): Payer: Self-pay

## 2021-02-28 DIAGNOSIS — E041 Nontoxic single thyroid nodule: Secondary | ICD-10-CM

## 2021-02-28 DIAGNOSIS — C73 Malignant neoplasm of thyroid gland: Secondary | ICD-10-CM | POA: Insufficient documentation

## 2021-02-28 DIAGNOSIS — D4989 Neoplasm of unspecified behavior of other specified sites: Secondary | ICD-10-CM

## 2021-02-28 LAB — CBC WITH DIFFERENTIAL/PLATELET
Abs Immature Granulocytes: 0.01 10*3/uL (ref 0.00–0.07)
Basophils Absolute: 0 10*3/uL (ref 0.0–0.1)
Basophils Relative: 0 %
Eosinophils Absolute: 0.1 10*3/uL (ref 0.0–0.5)
Eosinophils Relative: 1 %
HCT: 45.2 % (ref 36.0–46.0)
Hemoglobin: 15.1 g/dL — ABNORMAL HIGH (ref 12.0–15.0)
Immature Granulocytes: 0 %
Lymphocytes Relative: 28 %
Lymphs Abs: 2.2 10*3/uL (ref 0.7–4.0)
MCH: 29 pg (ref 26.0–34.0)
MCHC: 33.4 g/dL (ref 30.0–36.0)
MCV: 86.8 fL (ref 80.0–100.0)
Monocytes Absolute: 0.5 10*3/uL (ref 0.1–1.0)
Monocytes Relative: 7 %
Neutro Abs: 4.8 10*3/uL (ref 1.7–7.7)
Neutrophils Relative %: 64 %
Platelets: 285 10*3/uL (ref 150–400)
RBC: 5.21 MIL/uL — ABNORMAL HIGH (ref 3.87–5.11)
RDW: 13.2 % (ref 11.5–15.5)
WBC: 7.6 10*3/uL (ref 4.0–10.5)
nRBC: 0 % (ref 0.0–0.2)

## 2021-02-28 LAB — BASIC METABOLIC PANEL WITH GFR
Anion gap: 9 (ref 5–15)
BUN: 22 mg/dL — ABNORMAL HIGH (ref 6–20)
CO2: 26 mmol/L (ref 22–32)
Calcium: 9.8 mg/dL (ref 8.9–10.3)
Chloride: 105 mmol/L (ref 98–111)
Creatinine, Ser: 0.92 mg/dL (ref 0.44–1.00)
GFR, Estimated: >60 mL/min
Glucose, Bld: 102 mg/dL — ABNORMAL HIGH (ref 70–99)
Potassium: 3.9 mmol/L (ref 3.5–5.1)
Sodium: 140 mmol/L (ref 135–145)

## 2021-02-28 LAB — PROTIME-INR
INR: 0.9 (ref 0.8–1.2)
Prothrombin Time: 12.5 seconds (ref 11.4–15.2)

## 2021-02-28 MED ORDER — FENTANYL CITRATE (PF) 100 MCG/2ML IJ SOLN
INTRAMUSCULAR | Status: AC
  Filled 2021-02-28: qty 2

## 2021-02-28 MED ORDER — FENTANYL CITRATE (PF) 100 MCG/2ML IJ SOLN
INTRAMUSCULAR | Status: AC | PRN
  Administered 2021-02-28 (×2): 50 ug via INTRAVENOUS

## 2021-02-28 MED ORDER — MIDAZOLAM HCL 2 MG/2ML IJ SOLN
INTRAMUSCULAR | Status: AC | PRN
  Administered 2021-02-28 (×2): 1 mg via INTRAVENOUS

## 2021-02-28 MED ORDER — LIDOCAINE HCL (PF) 1 % IJ SOLN
INTRAMUSCULAR | Status: AC | PRN
  Administered 2021-02-28 (×2): 5 mL via INTRADERMAL

## 2021-02-28 MED ORDER — SODIUM CHLORIDE 0.9 % IV SOLN: INTRAVENOUS | Status: DC

## 2021-02-28 MED ORDER — LIDOCAINE-EPINEPHRINE (PF) 2 %-1:200000 IJ SOLN
INTRAMUSCULAR | Status: AC
  Filled 2021-02-28: qty 20

## 2021-02-28 MED ORDER — MIDAZOLAM HCL 2 MG/2ML IJ SOLN
INTRAMUSCULAR | Status: AC
  Filled 2021-02-28: qty 2

## 2021-02-28 NOTE — Procedures (Signed)
Pre Procedure Dx: Indeterminate left thyroid nodule and adjacent cervical LAN Post Procedural Dx: Same  Technically successful US guided biopsy of indeterminate left thyroid nodule (labeled #1; 1.3 cm, TR5) Technically successful US guided biopsy of adjacent pathologic appearing cervical lymph node.   EBL: None No immediate complications.   Ronny Bacon, MD Pager #: [phone]

## 2021-02-28 NOTE — Discharge Instructions (Signed)
Interventional radiology phone numbers [phone] After hours [phone]  Needle Biopsy, Care After These instructions tell you how to care for yourself after your procedure. Your doctor may also give you more specific instructions. Call your doctor if you have any problems or questions. What can I expect after the procedure? After the procedure, it is common to have:  Soreness.  Bruising.  Mild pain. Follow these instructions at home: 1. Return to your normal activities as told by your doctor. Ask your doctor what activities are safe for you. 2. Take over-the-counter and prescription medicines only as told by your doctor. 3. Wash your hands with soap and water before you change your bandage (dressing). If you cannot use soap and water, use hand sanitizer. 4. Follow instructions from your doctor about: ? How to take care of your puncture site. ? When and how to change your bandage. ? When to remove your bandage. 5. Check your puncture site every day for signs of infection. Watch for: ? Redness, swelling, or pain. ? Fluid or blood. ? Pus or a bad smell. ? Warmth. 6. Do not take baths, swim, or use a hot tub until your doctor approves. You may remove your dressing tomorrow and shower. 7. Keep all follow-up visits as told by your doctor. This is important.   Contact a doctor if you have:  A fever.  Redness, swelling, or pain at the puncture site, and it lasts longer than a few days.  Fluid, blood, or pus coming from the puncture site.  Warmth coming from the puncture site. Get help right away if:  You have a lot of bleeding from the puncture site. Summary  After the procedure, it is common to have soreness, bruising, or mild pain at the puncture site.  Check your puncture site every day for signs of infection, such as redness, swelling, or pain.  Get help right away if you have severe bleeding from your puncture site. This information is not intended to replace advice  given to you by your health care provider. Make sure you discuss any questions you have with your health care provider. Document Revised: 04/14/2020 Document Reviewed: 04/14/2020 Elsevier Patient Education  2021 [employer].     Moderate Conscious Sedation, Adult, Care After This sheet gives you information about how to care for yourself after your procedure. Your health care provider may also give you more specific instructions. If you have problems or questions, contact your health care provider. What can I expect after the procedure? After the procedure, it is common to have:  Sleepiness for several hours.  Impaired judgment for several hours.  Difficulty with balance.  Vomiting if you eat too soon. Follow these instructions at home: For the time period you were told by your health care provider:  Rest.  Do not participate in activities where you could fall or become injured.  Do not drive or use machinery.  Do not drink alcohol.  Do not take sleeping pills or medicines that cause drowsiness.  Do not make important decisions or sign legal documents.  Do not take care of children on your own.      Eating and drinking 1. Follow the diet recommended by your health care provider. 2. Drink enough fluid to keep your urine pale yellow. 3. If you vomit: ? Drink water, juice, or soup when you can drink without vomiting. ? Make sure you have little or no nausea before eating solid foods.   General instructions  Take over-the-counter and  prescription medicines only as told by your health care provider.  Have a responsible adult stay with you for the time you are told. It is important to have someone help care for you until you are awake and alert.  Do not smoke.  Keep all follow-up visits as told by your health care provider. This is important. Contact a health care provider if:  You are still sleepy or having trouble with balance after 24 hours.  You feel  light-headed.  You keep feeling nauseous or you keep vomiting.  You develop a rash.  You have a fever.  You have redness or swelling around the IV site. Get help right away if:  You have trouble breathing.  You have new-onset confusion at home. Summary  After the procedure, it is common to feel sleepy, have impaired judgment, or feel nauseous if you eat too soon.  Rest after you get home. Know the things you should not do after the procedure.  Follow the diet recommended by your health care provider and drink enough fluid to keep your urine pale yellow.  Get help right away if you have trouble breathing or new-onset confusion at home. This information is not intended to replace advice given to you by your health care provider. Make sure you discuss any questions you have with your health care provider. Document Revised: 02/12/2020 Document Reviewed: 09/10/2019 Elsevier Patient Education  2021 [employer].

## 2021-02-28 NOTE — Consult Note (Signed)
Chief Complaint: Patient was seen in consultation today for image guided biopsy of left thyroid nodule as well as cervical lymph node  Referring Physician(s): Fagan,Roy  Supervising Physician: Sandi Mariscal  Patient Status: [facility] - Out-pt  History of Present Illness: Claudia Acevedo is a 55 y.o. female with past medical history of GERD who underwent recent imaging for right-sided neck mass which revealed findings most consistent with a benign lipoma; also noted was an enlarged left level 3 lymph node (neck),  left level 3 lymph node inferior and medial (neck), 6 mm rounded density in the lateral aspect of the right parotid, accessory parotid tissue over the masseter muscle more prominent right and left as well as highly suspicious 1.3 cm superior left thyroid nodule.  She presents today for image guided aspiration/biopsy of both left thyroid nodule and cervical lymph node.  No prior history of cancer.  She is a non-smoker.  Past Medical History:  Diagnosis Date  . GERD (gastroesophageal reflux disease)     Past Surgical History:  Procedure Laterality Date  . BREAST SURGERY  03/1986   lft fib removed from left breast  . LIPOMA EXCISION Left 10/19/2013   Procedure: EXCISION THIGH LIPOMA;  Surgeon: Merrie Roof, MD;  Location: [place];  Service: General;  Laterality: Left;  . WISDOM TOOTH EXTRACTION  2004    Allergies: Patient has no known allergies.  Medications: Prior to Admission medications   Medication Sig Start Date End Date Taking? Authorizing Provider  atorvastatin (LIPITOR) 20 MG tablet Take 20 mg by mouth at bedtime. 11/07/20  Yes [provider]     Family History  Problem Relation Age of Onset  . Hyperlipidemia Mother   . Hypertension Mother   . ALS Father     Social History   Socioeconomic History  . Marital status: Married    Spouse name: Not on file  . Number of children: Not on file  . Years of education: Not on file  .  Highest education level: Not on file  Occupational History  . Not on file  Tobacco Use  . Smoking status: Never Smoker  . Smokeless tobacco: Never Used  Substance and Sexual Activity  . Alcohol use: Yes    Comment: twice month  . Drug use: No  . Sexual activity: Yes    Birth control/protection: I.U.D.  Other Topics Concern  . Not on file  Social History Narrative  . Not on file   Social Determinants of Health   Financial Resource Strain: Not on file  Food Insecurity: Not on file  Transportation Needs: Not on file  Physical Activity: Not on file  Stress: Not on file  Social Connections: Not on file      Review of Systems denies fever, headache, chest pain, dyspnea, cough, abdominal/back pain, nausea, vomiting or bleeding.  Vital Signs: BP (!) 143/109   Pulse 79   Temp 98.2 F (36.8 C) (Oral)   Resp 20   SpO2 100%   Physical Exam awake, alert.  Chest clear to auscultation bilaterally.  Heart with regular rate and rhythm.  Abdomen soft, positive bowel sounds, nontender.  No lower extremity edema.  Imaging: CT Soft Tissue Neck W Contrast  Result Date: 01/31/2021 CLINICAL DATA:  Right-sided neck mass.  Lipoma. EXAM: CT NECK WITH CONTRAST TECHNIQUE: Multidetector CT imaging of the neck was performed using the standard protocol following the bolus administration of intravenous contrast. CONTRAST:  125mL OMNIPAQUE IOHEXOL 300 MG/ML  SOLN  COMPARISON:  None. FINDINGS: Pharynx and larynx: No focal mucosal or submucosal lesions are present. Nasopharynx is clear. Soft palate and tongue base are within normal limits. Epiglottis is normal. Aryepiglottic folds and piriform sinuses are clear. Vocal cords are midline and symmetric. Trachea is clear. Salivary glands: The 6 mm rounded density is noted in the lateral aspect of the right parotid gland. This likely represents an intraparotid node. Smaller intraparotid nodes are present on left. No other parotid mass present. Accessory parotid  tissue over the masseter muscle is more prominent right than left. The submandibular glands and ducts are within normal limits. Thyroid: Normal. Lymph nodes: Enlarged left level 3 lymph node measures 11 x 11 x 21 mm. Just inferior and medial is a calcified node which measures 10 x 9 x 9 mm. No significant right-sided adenopathy is present. Vascular: No vascular lesions are present. Limited intracranial: No acute infarct, hemorrhage, or mass lesion is present. Visualized orbits: The globes and orbits are within normal limits. Mastoids and visualized paranasal sinuses: Focal mucosal thickening is present in the right sphenoid sinus. A polyp or mucous retention cyst is present in the left maxillary sinus. Paranasal sinuses and mastoid air cells are otherwise clear. The globes and orbits are within normal limits. Skeleton: Vertebral body heights and alignment are normal. No focal lytic or blastic lesions are present. Upper chest: Lung apices are clear. Thoracic inlet is within normal limits. Other: A well-circumscribed fat containing right supraclavicular mass lesion measures 5.2 x 4.4 x 6.7 cm. No significant hyperdense components are associated. No other focal lipoma noted. IMPRESSION: 1. 5.2 x 4.4 x 6.7 cm fatty mass lesion in the right supraclavicular area is most consistent with a benign lipoma. No malignant features evident. 2. Enlarged left level 3 lymph node measures 11 x 11 x 21 mm. 3. 10 x 9 x 9 mm calcified left level 3 lymph node inferior and medial. These are concerning for metastatic nodes. Possibly related to an occult thyroid lesion. Consider biopsy. 4. 6 mm rounded density in the lateral aspect of the right parotid gland likely represents an intraparotid node. 5. No significant right-sided adenopathy. 6. Accessory parotid tissue over the masseter muscle is more prominent right than left. Electronically Signed   By: San Morelle M.D.   On: 01/31/2021 15:24   US THYROID  Result Date:  02/16/2021 CLINICAL DATA:  Cervical adenopathy on CT neck.  Evaluate thyroid. EXAM: THYROID ULTRASOUND TECHNIQUE: Ultrasound examination of the thyroid gland and adjacent soft tissues was performed. COMPARISON:  CT 01/31/2021 FINDINGS: Parenchymal Echotexture: Mildly heterogenous Isthmus: 0.2 cm thickness Right lobe: 4.1 x 1.6 x 1.6 cm Left lobe: 3.7 x 1.6 x 1.5 cm _________________________________________________________ Estimated total number of nodules >/= 1 cm: 1 Number of spongiform nodules >/=  2 cm not described below (TR1): 0 Number of mixed cystic and solid nodules >/= 1.5 cm not described below (TR2): 0 _________________________________________________________ Nodule # 1: Location: Left; superior Maximum size: 1.3 cm; Other 2 dimensions: 1 x 1 cm Composition: solid/almost completely solid (2) Echogenicity: hypoechoic (2) Shape: taller-than-wide (3) Margins: lobulated/irregular (2) Echogenic foci: punctate echogenic foci (3) ACR TI-RADS total points: 12. ACR TI-RADS risk category: TR5 (>/= 7 points). ACR TI-RADS recommendations: **Given size (>/= 1.0 cm) and appearance, fine needle aspiration of this highly suspicious nodule should be considered based on TI-RADS criteria. _________________________________________________________ Nodule # 2: 0.7 cm hypoechoic nodule without calcifications, mid left; This nodule does NOT meet TI-RADS criteria for biopsy or dedicated follow-up. Abnormal hypoechoic  left cervical lymph node measuring 1 cm short axis diameter, and a smaller hypoechoic node without definite fatty hilum measuring 0.8 cm. Homogeneous right supraclavicular mass corresponding to lipoma seen on prior CT also noted. IMPRESSION: 1. Normal-sized thyroid with left nodules. 2. Recommend FNA biopsy of highly suspicious 1.3 cm superior left nodule, and concurrent FNA biopsy of regional adenopathy. The above is in keeping with the ACR TI-RADS recommendations - J Am Coll Radiol 2017;14:587-595. Electronically  Signed   By: Lucrezia Europe M.D.   On: 02/16/2021 15:18    Labs:  CBC: Recent Labs    02/28/21 1147  WBC 7.6  HGB 15.1*  HCT 45.2  PLT 285    COAGS: Recent Labs    02/28/21 1147  INR 0.9    BMP: Recent Labs    02/28/21 1147  NA 140  K 3.9  CL 105  CO2 26  GLUCOSE 102*  BUN 22*  CALCIUM 9.8  CREATININE 0.92  GFRNONAA >60    LIVER FUNCTION TESTS: No results for input(s): BILITOT, AST, ALT, ALKPHOS, PROT, ALBUMIN in the last 8760 hours.  TUMOR MARKERS: No results for input(s): AFPTM, CEA, CA199, CHROMGRNA in the last 8760 hours.  Assessment and Plan: 55 y.o. female with past medical history of GERD who underwent recent imaging for right-sided neck mass which revealed findings most consistent with a benign lipoma; also noted was an enlarged left level 3 lymph node (neck),  left level 3 lymph node inferior and medial (neck), 6 mm rounded density in the lateral aspect of the right parotid, accessory parotid tissue over the masseter muscle more prominent right and left as well as highly suspicious 1.3 cm superior left thyroid nodule.  She presents today for image guided aspiration/biopsy of both left thyroid nodule and cervical lymph node.  No prior history of cancer.  She is a non-smoker.Risks and benefits of procedures was discussed with the patient  including, but not limited to bleeding, infection, damage to adjacent structures or low yield requiring additional tests.  All of the questions were answered and there is agreement to proceed.  Consent signed and in chart.     Thank you for this interesting consult.  I greatly enjoyed meeting Claudia Acevedo and look forward to participating in their care.  A copy of this report was sent to the requesting provider on this date.  Electronically Signed: D. Rowe Robert, PA-C 02/28/2021, 12:17 PM   I spent a total of 20 minutes    in face to face in clinical consultation, greater than 50% of which was counseling/coordinating  care for image guided biopsy of left thyroid nodule and cervical lymph node

## 2021-03-01 LAB — CYTOLOGY - NON PAP

## 2021-03-02 LAB — SURGICAL PATHOLOGY

## 2021-03-17 ENCOUNTER — Other Ambulatory Visit: Payer: Self-pay | Admitting: Otolaryngology

## 2021-03-24 NOTE — Pre-Procedure Instructions (Signed)
Surgical Instructions    Your procedure is scheduled on Wednesday June 1st.  Report to [facility] Main Entrance "A" at 06:30 A.M., then check in with the Admitting office.  Call this number if you have problems the morning of surgery:  [phone]   If you have any questions prior to your surgery date call [phone]: Open Monday-Friday 8am-4pm    Remember:  Do not eat or drink after midnight the night before your surgery     Take these medicines the morning of surgery with A SIP OF WATER   NONE     As of today, STOP taking any Aspirin (unless otherwise instructed by your surgeon) Aleve, Naproxen, Ibuprofen, Motrin, Advil, Goody's, BC's, all herbal medications, fish oil, and all vitamins.                     Do not wear jewelry, make up, or nail polish DO Not wear nail polish, gel polish, artificial nails, or any other type of covering on natural nails including finger and toenails. If patients have artificial nails, gel coating, etc. that need to be removed by a nail saloon please have this removed prior to surgery or surgery may need to be canceled/delayed if the surgeon/ anesthesia feels like the patient is unable to be adequately monitored.            Do not wear lotions, powders, perfumes, or deodorant.            Do not shave 48 hours prior to surgery.              Do not bring valuables to the hospital.            [facility] is not responsible for any belongings or valuables.  Do NOT Smoke (Tobacco/Vaping) or drink Alcohol 24 hours prior to your procedure If you use a CPAP at night, you may bring all equipment for your overnight stay.   Contacts, glasses, dentures or partials may not be worn into surgery, please bring cases for these belongings   For patients admitted to the hospital, discharge time will be determined by your treatment team.   Patients discharged the day of surgery will not be allowed to drive home, and someone needs to stay with them for 24  hours.    Special instructions:    Oral Hygiene is also important to reduce your risk of infection.  Remember - BRUSH YOUR TEETH THE MORNING OF SURGERY WITH YOUR REGULAR TOOTHPASTE   [facility]- Preparing For Surgery  Before surgery, you can play an important role. Because skin is not sterile, your skin needs to be as free of germs as possible. You can reduce the number of germs on your skin by washing with CHG (chlorahexidine gluconate) Soap before surgery.  CHG is an antiseptic cleaner which kills germs and bonds with the skin to continue killing germs even after washing.     Please do not use if you have an allergy to CHG or antibacterial soaps. If your skin becomes reddened/irritated stop using the CHG.  Do not shave (including legs and underarms) for at least 48 hours prior to first CHG shower. It is OK to shave your face.  Please follow these instructions carefully.    1.  Shower the NIGHT BEFORE SURGERY and the MORNING OF SURGERY with CHG Soap.   If you chose to wash your hair, wash your hair first as usual with your normal shampoo. After you shampoo,  rinse your hair and body thoroughly to remove the shampoo.  Then [employer] and genitals (private parts) with your normal soap and rinse thoroughly to remove soap.  2. After that Use CHG Soap as you would any other liquid soap. You can apply CHG directly to the skin and wash gently with a scrungie or a clean washcloth.   3. Apply the CHG Soap to your body ONLY FROM THE NECK DOWN.  Do not use on open wounds or open sores. Avoid contact with your eyes, ears, mouth and genitals (private parts). Wash Face and genitals (private parts)  with your normal soap.   4. Wash thoroughly, paying special attention to the area where your surgery will be performed.  5. Thoroughly rinse your body with warm water from the neck down.  6. DO NOT shower/wash with your normal soap after using and rinsing off the CHG Soap.  7. Pat yourself dry with a  CLEAN TOWEL.  8. Wear CLEAN PAJAMAS to bed the night before surgery  9. Place CLEAN SHEETS on your bed the night before your surgery  10. DO NOT SLEEP WITH PETS.   Day of Surgery:  Take a shower with CHG soap. Wear Clean/Comfortable clothing the morning of surgery Do not apply any deodorants/lotions.   Remember to brush your teeth WITH YOUR REGULAR TOOTHPASTE.   Please read over the following fact sheets that you were given.

## 2021-03-28 ENCOUNTER — Encounter (HOSPITAL_COMMUNITY)
Admission: RE | Admit: 2021-03-28 | Discharge: 2021-03-28 | Disposition: A | Discharge status: Home / Self Care | Payer: BC Managed Care – PPO | Source: Ambulatory Visit | Attending: Otolaryngology | Admitting: Otolaryngology

## 2021-03-28 ENCOUNTER — Other Ambulatory Visit: Payer: Self-pay

## 2021-03-28 ENCOUNTER — Encounter (HOSPITAL_COMMUNITY): Payer: Self-pay

## 2021-03-28 NOTE — Progress Notes (Signed)
PCP - Asencion Noble Cardiologist - Denies  Chest x-ray - Not indicated EKG - Not indicated Stress Test - Denies ECHO - Denies Cardiac Cath - Denies  Sleep Study - Denies  DM - Denies  COVID TEST- Not indicated  Anesthesia review: No  Patient denies shortness of breath, fever, cough and chest pain at PAT appointment   All instructions explained to the patient, with a verbal understanding of the material. Patient agrees to go over the instructions while at home for a better understanding.  The opportunity to ask questions was provided.

## 2021-03-28 NOTE — Anesthesia Preprocedure Evaluation (Addendum)
Anesthesia Evaluation  Patient identified by MRN, date of birth, ID band Patient awake    Reviewed: Allergy & Precautions, Patient's Chart, lab work & pertinent test results  History of Anesthesia Complications Negative for: history of anesthetic complications  Airway Mallampati: II  TM Distance: >3 FB Neck ROM: Full    Dental  (+) Teeth Intact   Pulmonary neg pulmonary ROS,    Pulmonary exam normal        Cardiovascular negative cardio ROS   Rhythm:Regular Rate:Normal     Neuro/Psych negative neurological ROS  negative psych ROS   GI/Hepatic Neg liver ROS, GERD  ,  Endo/Other  negative endocrine ROS  Renal/GU negative Renal ROS  negative genitourinary   Musculoskeletal negative musculoskeletal ROS (+)   Abdominal (+)  Abdomen: soft. Bowel sounds: normal.  Peds  Hematology negative hematology ROS (+)   Anesthesia Other Findings THYROID MASS  Reproductive/Obstetrics negative OB ROS                            Anesthesia Physical Anesthesia Plan  ASA: II  Anesthesia Plan: General   Post-op Pain Management:    Induction: Intravenous  PONV Risk Score and Plan: 3 and Treatment may vary due to age or medical condition, Ondansetron, Dexamethasone and Midazolam  Airway Management Planned: Oral ETT  Additional Equipment: None  Intra-op Plan:   Post-operative Plan: Extubation in OR  Informed Consent: I have reviewed the patients History and Physical, chart, labs and discussed the procedure including the risks, benefits and alternatives for the proposed anesthesia with the patient or authorized representative who has indicated his/her understanding and acceptance.     Dental advisory given  Plan Discussed with: CRNA  Anesthesia Plan Comments: (Lab Results      Component                Value               Date                      WBC                      7.6                  02/28/2021                HGB                      15.1 (H)            02/28/2021                HCT                      45.2                02/28/2021                MCV                      86.8                02/28/2021                PLT  285                 02/28/2021           Lab Results      Component                Value               Date                      NA                       140                 02/28/2021                K                        3.9                 02/28/2021                CO2                      26                  02/28/2021                GLUCOSE                  102 (H)             02/28/2021                BUN                      22 (H)              02/28/2021                CREATININE               0.92                02/28/2021                CALCIUM                  9.8                 02/28/2021                GFRNONAA                 >60                 02/28/2021                GFRAA                    >60                 11/06/2018          )      Anesthesia Quick Evaluation

## 2021-03-29 ENCOUNTER — Encounter (HOSPITAL_COMMUNITY): Payer: Self-pay | Admitting: Otolaryngology

## 2021-03-29 ENCOUNTER — Observation Stay (HOSPITAL_COMMUNITY)
Admission: RE | Admit: 2021-03-29 | Discharge: 2021-03-31 | Disposition: A | Discharge status: Home / Self Care | Payer: BC Managed Care – PPO | Attending: Otolaryngology | Admitting: Otolaryngology

## 2021-03-29 ENCOUNTER — Encounter (HOSPITAL_COMMUNITY)
Admission: RE | Disposition: A | Discharge status: Home / Self Care | Payer: Self-pay | Source: Home / Self Care | Attending: Otolaryngology

## 2021-03-29 ENCOUNTER — Other Ambulatory Visit: Payer: Self-pay

## 2021-03-29 ENCOUNTER — Ambulatory Visit (HOSPITAL_COMMUNITY): Payer: BC Managed Care – PPO | Admitting: Anesthesiology

## 2021-03-29 DIAGNOSIS — C770 Secondary and unspecified malignant neoplasm of lymph nodes of head, face and neck: Secondary | ICD-10-CM | POA: Diagnosis not present

## 2021-03-29 DIAGNOSIS — Z20822 Contact with and (suspected) exposure to covid-19: Secondary | ICD-10-CM | POA: Diagnosis not present

## 2021-03-29 DIAGNOSIS — C73 Malignant neoplasm of thyroid gland: Secondary | ICD-10-CM | POA: Diagnosis not present

## 2021-03-29 DIAGNOSIS — E890 Postprocedural hypothyroidism: Secondary | ICD-10-CM | POA: Diagnosis present

## 2021-03-29 HISTORY — PX: THYROIDECTOMY: SHX17

## 2021-03-29 LAB — CALCIUM
Calcium: 8.7 mg/dL — ABNORMAL LOW (ref 8.9–10.3)
Calcium: 9.1 mg/dL (ref 8.9–10.3)

## 2021-03-29 LAB — SARS CORONAVIRUS 2 BY RT PCR (HOSPITAL ORDER, PERFORMED IN ~~LOC~~ HOSPITAL LAB): SARS Coronavirus 2: NEGATIVE

## 2021-03-29 SURGERY — THYROIDECTOMY
Anesthesia: General | Laterality: Left

## 2021-03-29 MED ORDER — SUFENTANIL CITRATE 50 MCG/ML IV SOLN
INTRAVENOUS | Status: AC
  Filled 2021-03-29: qty 1

## 2021-03-29 MED ORDER — KCL IN DEXTROSE-NACL 20-5-0.45 MEQ/L-%-% IV SOLN
INTRAVENOUS | Status: DC
  Filled 2021-03-29 (×4): qty 1000

## 2021-03-29 MED ORDER — ONDANSETRON HCL 4 MG/2ML IJ SOLN: 4.0000 mg | INTRAMUSCULAR | Status: DC | PRN

## 2021-03-29 MED ORDER — 0.9 % SODIUM CHLORIDE (POUR BTL) OPTIME
TOPICAL | Status: DC | PRN
  Administered 2021-03-29: 1000 mL

## 2021-03-29 MED ORDER — ONDANSETRON HCL 4 MG PO TABS: 4.0000 mg | ORAL_TABLET | ORAL | Status: DC | PRN

## 2021-03-29 MED ORDER — PHENYLEPHRINE 40 MCG/ML (10ML) SYRINGE FOR IV PUSH (FOR BLOOD PRESSURE SUPPORT)
PREFILLED_SYRINGE | INTRAVENOUS | Status: AC
  Filled 2021-03-29: qty 10

## 2021-03-29 MED ORDER — CHLORHEXIDINE GLUCONATE 0.12 % MT SOLN
15.0000 mL | Freq: Once | OROMUCOSAL | Status: AC
  Administered 2021-03-29: 15 mL via OROMUCOSAL
  Filled 2021-03-29: qty 15

## 2021-03-29 MED ORDER — CALCIUM CARBONATE-VITAMIN D 500-200 MG-UNIT PO TABS
2.0000 | ORAL_TABLET | Freq: Two times a day (BID) | ORAL | Status: DC
  Administered 2021-03-29 – 2021-03-30 (×3): 2 via ORAL
  Filled 2021-03-29 (×4): qty 2

## 2021-03-29 MED ORDER — ACETAMINOPHEN 500 MG PO TABS
1000.0000 mg | ORAL_TABLET | Freq: Once | ORAL | Status: AC
  Administered 2021-03-29: 1000 mg via ORAL
  Filled 2021-03-29: qty 2

## 2021-03-29 MED ORDER — LIDOCAINE 2% (20 MG/ML) 5 ML SYRINGE
INTRAMUSCULAR | Status: AC
  Filled 2021-03-29: qty 5

## 2021-03-29 MED ORDER — MORPHINE SULFATE (PF) 2 MG/ML IV SOLN: 2.0000 mg | INTRAVENOUS | Status: DC | PRN

## 2021-03-29 MED ORDER — CEFAZOLIN SODIUM-DEXTROSE 2-3 GM-%(50ML) IV SOLR
INTRAVENOUS | Status: DC | PRN
  Administered 2021-03-29: 2 g via INTRAVENOUS

## 2021-03-29 MED ORDER — ATORVASTATIN CALCIUM 10 MG PO TABS
20.0000 mg | ORAL_TABLET | Freq: Every day | ORAL | Status: DC
  Administered 2021-03-29 – 2021-03-30 (×2): 20 mg via ORAL
  Filled 2021-03-29 (×3): qty 2

## 2021-03-29 MED ORDER — LIDOCAINE-EPINEPHRINE 1 %-1:100000 IJ SOLN
INTRAMUSCULAR | Status: DC | PRN
  Administered 2021-03-29: 6 mL

## 2021-03-29 MED ORDER — PHENYLEPHRINE 40 MCG/ML (10ML) SYRINGE FOR IV PUSH (FOR BLOOD PRESSURE SUPPORT)
PREFILLED_SYRINGE | INTRAVENOUS | Status: DC | PRN
  Administered 2021-03-29: 80 ug via INTRAVENOUS

## 2021-03-29 MED ORDER — ZOLPIDEM TARTRATE 5 MG PO TABS: 5.0000 mg | ORAL_TABLET | Freq: Every evening | ORAL | Status: DC | PRN

## 2021-03-29 MED ORDER — SUCCINYLCHOLINE CHLORIDE 200 MG/10ML IV SOSY
PREFILLED_SYRINGE | INTRAVENOUS | Status: AC
  Filled 2021-03-29: qty 10

## 2021-03-29 MED ORDER — FENTANYL CITRATE (PF) 100 MCG/2ML IJ SOLN: 25.0000 ug | INTRAMUSCULAR | Status: DC | PRN

## 2021-03-29 MED ORDER — DEXAMETHASONE SODIUM PHOSPHATE 10 MG/ML IJ SOLN
INTRAMUSCULAR | Status: AC
  Filled 2021-03-29: qty 1

## 2021-03-29 MED ORDER — CEFAZOLIN SODIUM-DEXTROSE 2-4 GM/100ML-% IV SOLN
INTRAVENOUS | Status: AC
  Filled 2021-03-29: qty 100

## 2021-03-29 MED ORDER — PROMETHAZINE HCL 25 MG/ML IJ SOLN: 6.2500 mg | INTRAMUSCULAR | Status: DC | PRN

## 2021-03-29 MED ORDER — SUCCINYLCHOLINE CHLORIDE 200 MG/10ML IV SOSY
PREFILLED_SYRINGE | INTRAVENOUS | Status: DC | PRN
  Administered 2021-03-29: 100 mg via INTRAVENOUS

## 2021-03-29 MED ORDER — OXYCODONE HCL 5 MG/5ML PO SOLN: 5.0000 mg | Freq: Once | ORAL | Status: DC | PRN

## 2021-03-29 MED ORDER — LIDOCAINE-EPINEPHRINE 1 %-1:100000 IJ SOLN
INTRAMUSCULAR | Status: AC
  Filled 2021-03-29: qty 1

## 2021-03-29 MED ORDER — LIDOCAINE 2% (20 MG/ML) 5 ML SYRINGE
INTRAMUSCULAR | Status: DC | PRN
  Administered 2021-03-29: 100 mg via INTRAVENOUS

## 2021-03-29 MED ORDER — OXYCODONE HCL 5 MG PO TABS: 5.0000 mg | ORAL_TABLET | Freq: Once | ORAL | Status: DC | PRN

## 2021-03-29 MED ORDER — SODIUM CHLORIDE (PF) 0.9 % IJ SOLN
INTRAMUSCULAR | Status: AC
  Filled 2021-03-29: qty 10

## 2021-03-29 MED ORDER — LACTATED RINGERS IV SOLN: INTRAVENOUS | Status: DC | PRN

## 2021-03-29 MED ORDER — LACTATED RINGERS IV SOLN: INTRAVENOUS | Status: DC

## 2021-03-29 MED ORDER — ONDANSETRON HCL 4 MG/2ML IJ SOLN
INTRAMUSCULAR | Status: AC
  Filled 2021-03-29: qty 2

## 2021-03-29 MED ORDER — SUFENTANIL CITRATE 50 MCG/ML IV SOLN
INTRAVENOUS | Status: DC | PRN
  Administered 2021-03-29: 10 ug via INTRAVENOUS
  Administered 2021-03-29: 20 ug via INTRAVENOUS
  Administered 2021-03-29 (×2): 10 ug via INTRAVENOUS

## 2021-03-29 MED ORDER — PROPOFOL 10 MG/ML IV BOLUS
INTRAVENOUS | Status: DC | PRN
  Administered 2021-03-29: 50 mg via INTRAVENOUS
  Administered 2021-03-29: 150 mg via INTRAVENOUS

## 2021-03-29 MED ORDER — PHENYLEPHRINE HCL-NACL 20-0.9 MG/250ML-% IV SOLN
INTRAVENOUS | Status: DC | PRN
  Administered 2021-03-29: 25 ug/min via INTRAVENOUS

## 2021-03-29 MED ORDER — DEXAMETHASONE SODIUM PHOSPHATE 10 MG/ML IJ SOLN
INTRAMUSCULAR | Status: DC | PRN
  Administered 2021-03-29: 10 mg via INTRAVENOUS

## 2021-03-29 MED ORDER — PROPOFOL 10 MG/ML IV BOLUS
INTRAVENOUS | Status: AC
  Filled 2021-03-29: qty 20

## 2021-03-29 MED ORDER — MIDAZOLAM HCL 2 MG/2ML IJ SOLN
INTRAMUSCULAR | Status: AC
  Filled 2021-03-29: qty 2

## 2021-03-29 MED ORDER — OXYCODONE-ACETAMINOPHEN 5-325 MG PO TABS: 1.0000 | ORAL_TABLET | ORAL | Status: DC | PRN

## 2021-03-29 MED ORDER — ORAL CARE MOUTH RINSE: 15.0000 mL | Freq: Once | OROMUCOSAL | Status: AC

## 2021-03-29 MED ORDER — ONDANSETRON HCL 4 MG/2ML IJ SOLN
INTRAMUSCULAR | Status: DC | PRN
  Administered 2021-03-29: 4 mg via INTRAVENOUS

## 2021-03-29 MED ORDER — MIDAZOLAM HCL 2 MG/2ML IJ SOLN
INTRAMUSCULAR | Status: DC | PRN
  Administered 2021-03-29: 2 mg via INTRAVENOUS

## 2021-03-29 SURGICAL SUPPLY — 49 items
ADH SKN CLS APL DERMABOND .7 (GAUZE/BANDAGES/DRESSINGS) ×1
ADH SKN CLS LQ APL DERMABOND (GAUZE/BANDAGES/DRESSINGS) ×1
ATTRACTOMAT 16X20 MAGNETIC DRP (DRAPES) IMPLANT
BLADE CLIPPER SURG (BLADE) IMPLANT
BLADE SURG 15 STRL LF DISP TIS (BLADE) IMPLANT
BLADE SURG 15 STRL SS (BLADE)
CANISTER SUCT 3000ML PPV (MISCELLANEOUS) ×2 IMPLANT
CLEANER TIP ELECTROSURG 2X2 (MISCELLANEOUS) ×2 IMPLANT
CLIP VESOCCLUDE SM WIDE 24/CT (CLIP) IMPLANT
CNTNR URN SCR LID CUP LEK RST (MISCELLANEOUS) IMPLANT
CONT SPEC 4OZ STRL OR WHT (MISCELLANEOUS)
CORD BIPOLAR FORCEPS 12FT (ELECTRODE) ×2 IMPLANT
COVER SURGICAL LIGHT HANDLE (MISCELLANEOUS) ×2 IMPLANT
COVER WAND RF STERILE (DRAPES) ×2 IMPLANT
DERMABOND ADHESIVE PROPEN (GAUZE/BANDAGES/DRESSINGS) ×1
DERMABOND ADVANCED (GAUZE/BANDAGES/DRESSINGS) ×1
DERMABOND ADVANCED .7 DNX12 (GAUZE/BANDAGES/DRESSINGS) ×1 IMPLANT
DERMABOND ADVANCED .7 DNX6 (GAUZE/BANDAGES/DRESSINGS) IMPLANT
DRAIN CHANNEL 10F 3/8 F FF (DRAIN) ×2 IMPLANT
DRAPE HALF SHEET 40X57 (DRAPES) ×2 IMPLANT
ELECT COATED BLADE 2.86 ST (ELECTRODE) ×2 IMPLANT
ELECT REM PT RETURN 9FT ADLT (ELECTROSURGICAL) ×2
ELECTRODE REM PT RTRN 9FT ADLT (ELECTROSURGICAL) ×1 IMPLANT
EVACUATOR SILICONE 100CC (DRAIN) ×2 IMPLANT
FORCEPS BIPOLAR SPETZLER 8 1.0 (NEUROSURGERY SUPPLIES) ×2 IMPLANT
GAUZE 4X4 16PLY RFD (DISPOSABLE) ×2 IMPLANT
GLOVE BIO SURGEON STRL SZ 6.5 (GLOVE) ×2 IMPLANT
GLOVE ECLIPSE 7.5 STRL STRAW (GLOVE) ×2 IMPLANT
GLOVE SURG UNDER POLY LF SZ6.5 (GLOVE) ×2 IMPLANT
GOWN STRL REUS W/ TWL LRG LVL3 (GOWN DISPOSABLE) ×3 IMPLANT
GOWN STRL REUS W/TWL LRG LVL3 (GOWN DISPOSABLE) ×6
HEMOSTAT SURGICEL 2X14 (HEMOSTASIS) IMPLANT
KIT BASIN OR (CUSTOM PROCEDURE TRAY) ×2 IMPLANT
KIT TURNOVER KIT B (KITS) ×2 IMPLANT
NDL HYPO 25GX1X1/2 BEV (NEEDLE) ×1 IMPLANT
NEEDLE HYPO 25GX1X1/2 BEV (NEEDLE) ×2 IMPLANT
NS IRRIG 1000ML POUR BTL (IV SOLUTION) ×2 IMPLANT
PAD ARMBOARD 7.5X6 YLW CONV (MISCELLANEOUS) ×2 IMPLANT
PENCIL SMOKE EVACUATOR (MISCELLANEOUS) ×2 IMPLANT
POSITIONER HEAD DONUT 9IN (MISCELLANEOUS) ×2 IMPLANT
PROBE NERVBE PRASS .33 (MISCELLANEOUS) ×2 IMPLANT
SHEARS HARMONIC 9CM CVD (BLADE) ×2 IMPLANT
SPONGE INTESTINAL PEANUT (DISPOSABLE) ×2 IMPLANT
SUT ETHILON 2 0 FS 18 (SUTURE) ×2 IMPLANT
SUT SILK 2 0 PERMA HAND 18 BK (SUTURE) ×2 IMPLANT
SUT SILK 3 0 REEL (SUTURE) ×2 IMPLANT
SUT VICRYL 4-0 PS2 18IN ABS (SUTURE) ×2 IMPLANT
TRAY ENT MC OR (CUSTOM PROCEDURE TRAY) ×2 IMPLANT
TRAY FOLEY MTR SLVR 14FR STAT (SET/KITS/TRAYS/PACK) IMPLANT

## 2021-03-29 NOTE — H&P (Signed)
Cc: Thyroid cancer  HPI: The patient is a 55 y/o female who presents today for evaluation of her thyroid cancer. The patient is seen in consultation requested by Dr. Asencion Noble. The patient had a neck CT to evaluate a right subclavicular mass. The mass on the right was noted to be a lipoma but the CT showed enlarged left level 3 lymph nodes. Subsequent neck ultrasound showed a suspicious left thyroid nodule. The patient underwent FNA of the thyroid nodule and lymph node with pathology consistent with papillary thyroid carcinoma. The patient has no compressive symptoms. There is no family history of thyroid cancer.  Previous ENT surgery is denied.   Family health history: No HTN, DM, CAD, hearing loss or bleeding disorder.  Major events: Wisdom teeth extraction, fibroids removed, right breast and left leg surgery.  Ongoing medical problems: Reflux, thyroid nodules.  Social history: The patient is married. She denies the use of tobacco , alcohol or illegal drugs.   Exam: General: Communicates without difficulty, well nourished, no acute distress. Head: Normocephalic, no evidence injury, no tenderness, facial buttresses intact without stepoff. Eyes: PERRL, EOMI.  No scleral icterus, conjunctivae clear. Ears: External auditory canals clear bilaterally.  There is no edema or erythema.  Tympanic membrane is within normal limits bilaterally. Nose: Normal skin and external support.  Anterior rhinoscopy reveals healthy pink mucosa over the septum and turbinates.  No lesions or polyps were seen. Oral cavity: Lips without lesions, oral mucosa moist, no masses or lesions seen. Indirect  mirror laryngoscopy could not be tolerated.Pharynx: Clear, no erythema. Neck: Supple, full range of motion. Large right subclavicular lipoma. Salivary: Parotid and submandibular glands without mass. Left thyroid bed enlarged. Neuro:  CN 2-12 grossly intact. Gait normal. Vestibular: No nystagmus at any point of gaze.   Assessment  1.  Left thyroid nodule with enlarged left cervical lymph nodes. Pathology consistent with papillary thyroid carcinoma.  2. Vocal cords are mobile on laryngoscopy exam. No other suspicious mass or lesion is noted   Plan  1. The physical exam, CT, laryngoscopy, and pathology findings are reviewed with the patient and her husband at length. 2. The patient will need a total thyroidectomy with left neck dissection. The risks, benefits, alternatives, and details of the procedures are extensively reviewed with the patient. Questions are invited and answered. 3. The procedure will be scheduled as soon as possible.

## 2021-03-29 NOTE — Anesthesia Procedure Notes (Signed)
Procedure Name: Intubation Date/Time: 03/29/2021 8:40 AM Performed by: Claris Che, CRNA Pre-anesthesia Checklist: Patient identified, Emergency Drugs available, Suction available, Patient being monitored and Timeout performed Patient Re-evaluated:Patient Re-evaluated prior to induction Oxygen Delivery Method: Circle system utilized Preoxygenation: Pre-oxygenation with 100% oxygen Induction Type: IV induction and Cricoid Pressure applied Ventilation: Mask ventilation without difficulty Laryngoscope Size: Mac and 4 Grade View: Grade II Tube type: Oral Tube size: 7.0 mm Number of attempts: 1 Airway Equipment and Method: Stylet Placement Confirmation: ETT inserted through vocal cords under direct vision,  positive ETCO2 and breath sounds checked- equal and bilateral Secured at: 23 cm Tube secured with: Tape Dental Injury: Teeth and Oropharynx as per pre-operative assessment

## 2021-03-29 NOTE — Progress Notes (Signed)
   03/29/21 1848  Assess: MEWS Score  Temp 99.3 F (37.4 C)  BP (!) 142/80  Pulse Rate (!) 119  ECG Heart Rate (!) 117  Resp 20  Level of Consciousness Alert  SpO2 98 %  O2 Device Room Air  Assess: MEWS Score  MEWS Temp 0  MEWS Systolic 0  MEWS Pulse 2  MEWS RR 0  MEWS LOC 0  MEWS Score 2  MEWS Score Color Yellow  Treat  Pain Score 0  Faces Pain Scale 0  Notify: Provider  Provider Name/Title TEOH ,SU  Date Provider Notified 03/29/21  Time Provider Notified [id number]  Notification Type Page  Notification Reason Change in status  Provider response No new orders  Time of Provider Response [id number]   MD notified no new orders, incoming RN aware of yellow [place] protocol

## 2021-03-29 NOTE — Op Note (Signed)
DATE OF PROCEDURE:  03/29/2021                              OPERATIVE REPORT  SURGEON:  Leta Baptist, MD  PREOPERATIVE DIAGNOSES: 1. Papillary thyroid carcinoma 2. Metastatic left cervical lymph nodes.   POSTOPERATIVE DIAGNOSES: 1. Papillary thyroid carcinoma 2. Metastatic left cervical lymph nodes.   PROCEDURE PERFORMED: Total thyroidectomy with left neck dissection   ANESTHESIA:  General endotracheal tube anesthesia.   COMPLICATIONS:  None.   ESTIMATED BLOOD LOSS: 1000 ml   INDICATION FOR PROCEDURE: Claudia Acevedo is a 55 y.o. female with a history of thyroid cancer.  The patient previously had a neck CT to evaluate a right subclavicular mass. The mass was noted to be a lipoma but the CT showed enlarged left level 3 lymph nodes. Subsequent neck ultrasound showed a suspicious left thyroid nodule. The patient underwent FNA of the thyroid nodule and lymph node with both pathology consistent with papillary thyroid carcinoma. Based on the above findings, the decision was made for the patient to undergo the above stated procedures. Likelihood of success in reducing symptoms was also discussed.  The risks, benefits, alternatives, and details of the procedure were discussed with the patient.  Questions were invited and answered.  Informed consent was obtained.   DESCRIPTION:  The patient was taken to the operating room and placed supine on the operating table.  General endotracheal tube anesthesia was administered by the anesthesiologist.  A nerve monitoring endotracheal tube was used.  The nerve monitoring system was functional throughout the case.   The patient was positioned and prepped and draped in the standard fashion for neck dissection and thyroid surgery.  1% lidocaine with 1 100,000 epinephrine was infiltrated at the planned site of incision.  A left neck hockey stick incision was made.  The incision was carried down to the level of the platysma muscles.  Superiorly based and inferiorly based  subplatysmal flaps were elevated in the standard fashion.  The strap muscles were divided at midline, and retracted laterally, exposing the thyroid gland.   Careful dissection was performed to free the left thyroid lobe from the surrounding soft tissue. Significant fibrosis was noted.  The patient was noted to have a firm left thyroid nodule.  The left recurrent laryngeal nerve was identified and preserved. The external branch of the left superior laryngeal nerve was completely encased within the thyroid cancer. The nerve was transected. An inferior left parathyroid gland was also identified and preserved.  The same procedure was repeated on the right side. The entire thyroid gland was resected and sent to the pathology department for permanent histologic identification. The right recurrent laryngeal nerve and the superior laryngeal nerve were identified and preserved.   Attention was then focused on the left cervical lymph nodes. Careful neck dissection was carried out to free the left level 2, 3, and 4 cervical lymph nodes from the jugular vein, vagus nerve, and the spinal accessory nerve. Two pathology lymph nodes were identified. The lymph node chain was resected free and sent to the pathology department.  A #10 JP drain was placed.  The strap muscles were reapproximated with 4-0 Vicryl sutures.  The incision was closed in layers with 4-0 Vicryl and Dermabond.   The care of the patient was turned over to the anesthesiologist.  The patient was awakened from anesthesia without difficulty.  The patient was extubated and transferred to the recovery room in  good condition.   OPERATIVE FINDINGS:  Left thyroid papillary carcinoma and left neck metastasis.   SPECIMEN: Thyroid gland and left neck dissection specimens.   FOLLOWUP CARE:  The patient will be admitted for overnight observation.  Maja Mccaffery Cassie Freer 03/29/2021 3:41 PM

## 2021-03-29 NOTE — Plan of Care (Signed)

## 2021-03-29 NOTE — Anesthesia Postprocedure Evaluation (Signed)
Anesthesia Post Note  Patient: Claudia Acevedo  Procedure(s) Performed: TOTAL THYROIDECTOMY WITH LEFT NECK DISSECTION (Left )     Patient location during evaluation: PACU Anesthesia Type: General Level of consciousness: awake and alert Pain management: pain level controlled Vital Signs Assessment: post-procedure vital signs reviewed and stable Respiratory status: spontaneous breathing, nonlabored ventilation, respiratory function stable and patient connected to nasal cannula oxygen Cardiovascular status: blood pressure returned to baseline and stable Postop Assessment: no apparent nausea or vomiting Anesthetic complications: no   No complications documented.  Last Vitals:  Vitals:   03/29/21 1848 03/29/21 2051  BP: (!) 142/80 (!) 145/82  Pulse: (!) 119 (!) 126  Resp: 20 16  Temp: 37.4 C 37.4 C  SpO2: 98% 96%    Last Pain:  Vitals:   03/29/21 2051  TempSrc: Oral  PainSc:                  March Rummage Jeffory Snelgrove

## 2021-03-29 NOTE — Transfer of Care (Signed)
Immediate Anesthesia Transfer of Care Note  Patient: Claudia Acevedo  Procedure(s) Performed: TOTAL THYROIDECTOMY WITH LEFT NECK DISSECTION (Left )  Patient Location: PACU  Anesthesia Type:General  Level of Consciousness: awake, alert  and oriented  Airway & Oxygen Therapy: Patient Spontanous Breathing and Patient connected to nasal cannula oxygen  Post-op Assessment: Report given to RN, Post -op Vital signs reviewed and stable and Patient moving all extremities X 4  Post vital signs: Reviewed and stable  Last Vitals:  Vitals Value Taken Time  BP 142/89 03/29/21 1303  Temp    Pulse 86 03/29/21 1308  Resp 17 03/29/21 1308  SpO2 96 % 03/29/21 1308  Vitals shown include unvalidated device data.  Last Pain:  Vitals:   03/29/21 0704  TempSrc:   PainSc: 2       Patients Stated Pain Goal: 3 (17/47/15 9539)  Complications: No complications documented.

## 2021-03-30 ENCOUNTER — Encounter (HOSPITAL_COMMUNITY): Payer: Self-pay | Admitting: Otolaryngology

## 2021-03-30 DIAGNOSIS — C73 Malignant neoplasm of thyroid gland: Secondary | ICD-10-CM | POA: Diagnosis not present

## 2021-03-30 DIAGNOSIS — E890 Postprocedural hypothyroidism: Secondary | ICD-10-CM | POA: Diagnosis present

## 2021-03-30 LAB — SURGICAL PATHOLOGY

## 2021-03-30 LAB — CALCIUM: Calcium: 9.3 mg/dL (ref 8.9–10.3)

## 2021-03-30 MED ORDER — ACETAMINOPHEN 325 MG PO TABS
650.0000 mg | ORAL_TABLET | ORAL | Status: DC | PRN
  Administered 2021-03-30: 650 mg via ORAL
  Filled 2021-03-30: qty 2

## 2021-03-30 MED ORDER — OCTREOTIDE ACETATE 100 MCG/ML IJ SOLN
100.0000 ug | Freq: Three times a day (TID) | INTRAMUSCULAR | Status: DC
  Administered 2021-03-30 – 2021-03-31 (×4): 100 ug via SUBCUTANEOUS
  Filled 2021-03-30 (×5): qty 1

## 2021-03-30 NOTE — Plan of Care (Signed)

## 2021-03-30 NOTE — Progress Notes (Signed)
Subjective: No c/o overnight. Voice is strong.  Objective: Vital signs in last 24 hours: Temp:  [98.6 F (37 C)-99.3 F (37.4 C)] 98.6 F (37 C) (06/02 1441) Pulse Rate:  [79-126] 79 (06/02 1441) Resp:  [16-20] 16 (06/02 0255) BP: (125-156)/(71-91) 132/89 (06/02 1441) SpO2:  [94 %-99 %] 96 % (06/02 1441)  Neck incision c/d/i JP with chyle material. 70cc today.  No results for input(s): WBC, HGB, HCT, PLT in the last 72 hours. Recent Labs    03/29/21 2147 03/30/21 0609  CALCIUM 9.1 9.3    Medications:  I have reviewed the patient's current medications. Scheduled: . atorvastatin  20 mg Oral QHS  . calcium-vitamin D  2 tablet Oral BID  . octreotide  100 mcg Subcutaneous Q8H   Continuous: . 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 - Start octreotide. - NPO for now.   LOS: 0 days   Kenyan Karnes W Delpha Perko 03/30/2021, 4:45 PM

## 2021-03-31 DIAGNOSIS — C73 Malignant neoplasm of thyroid gland: Secondary | ICD-10-CM | POA: Diagnosis not present

## 2021-03-31 MED ORDER — OCTREOTIDE ACETATE 100 MCG/ML IJ SOLN: 100.0000 ug | Freq: Three times a day (TID) | INTRAMUSCULAR | 0 refills | Status: AC

## 2021-03-31 MED ORDER — OXYCODONE-ACETAMINOPHEN 5-325 MG PO TABS: 1.0000 | ORAL_TABLET | ORAL | 0 refills | Status: AC | PRN

## 2021-03-31 MED ORDER — OCTREOTIDE ACETATE 100 MCG/ML IJ SOLN: 100.0000 ug | Freq: Three times a day (TID) | INTRAMUSCULAR | 0 refills | Status: DC

## 2021-03-31 MED ORDER — OXYCODONE-ACETAMINOPHEN 5-325 MG PO TABS: 1.0000 | ORAL_TABLET | ORAL | 0 refills | Status: DC | PRN

## 2021-03-31 MED ORDER — LEVOTHYROXINE SODIUM 100 MCG PO TABS: 100.0000 ug | ORAL_TABLET | Freq: Every day | ORAL | 10 refills | Status: DC

## 2021-03-31 MED ORDER — CLINDAMYCIN HCL 300 MG PO CAPS: 300.0000 mg | ORAL_CAPSULE | Freq: Three times a day (TID) | ORAL | 0 refills | Status: DC

## 2021-03-31 MED ORDER — CLINDAMYCIN HCL 300 MG PO CAPS: 300.0000 mg | ORAL_CAPSULE | Freq: Three times a day (TID) | ORAL | 0 refills | Status: AC

## 2021-03-31 NOTE — Plan of Care (Signed)
  Problem: Education: Goal: Knowledge of General Education information will improve Description: Including pain rating scale, medication(s)/side effects and non-pharmacologic comfort measures 03/31/2021 1615 by Camillia Herter, RN Outcome: Progressing 03/31/2021 1114 by Camillia Herter, RN Outcome: Progressing   Problem: Health Behavior/Discharge Planning: Goal: Ability to manage health-related needs will improve 03/31/2021 1615 by Camillia Herter, RN Outcome: Progressing 03/31/2021 1114 by Camillia Herter, RN Outcome: Progressing   Problem: Clinical Measurements: Goal: Ability to maintain clinical measurements within normal limits will improve 03/31/2021 1615 by Camillia Herter, RN Outcome: Progressing 03/31/2021 1114 by Camillia Herter, RN Outcome: Progressing Goal: Will remain free from infection 03/31/2021 1615 by Camillia Herter, RN Outcome: Progressing 03/31/2021 1114 by Camillia Herter, RN Outcome: Progressing Goal: Diagnostic test results will improve 03/31/2021 1615 by Camillia Herter, RN Outcome: Progressing 03/31/2021 1114 by Camillia Herter, RN Outcome: Progressing Goal: Respiratory complications will improve 03/31/2021 1615 by Camillia Herter, RN Outcome: Progressing 03/31/2021 1114 by Julius Bowels A, RN Outcome: Progressing Goal: Cardiovascular complication will be avoided 03/31/2021 1615 by Camillia Herter, RN Outcome: Progressing 03/31/2021 1114 by Camillia Herter, RN Outcome: Progressing   Problem: Activity: Goal: Risk for activity intolerance will decrease 03/31/2021 1615 by Camillia Herter, RN Outcome: Progressing 03/31/2021 1114 by Julius Bowels A, RN Outcome: Progressing   Problem: Nutrition: Goal: Adequate nutrition will be maintained 03/31/2021 1615 by Camillia Herter, RN Outcome: Progressing 03/31/2021 1114 by Camillia Herter, RN Outcome: Progressing   Problem: Coping: Goal: Level of anxiety will decrease 03/31/2021 1615 by Camillia Herter, RN Outcome:  Progressing 03/31/2021 1114 by Julius Bowels A, RN Outcome: Progressing   Problem: Elimination: Goal: Will not experience complications related to bowel motility 03/31/2021 1615 by Camillia Herter, RN Outcome: Progressing 03/31/2021 1114 by Camillia Herter, RN Outcome: Progressing Goal: Will not experience complications related to urinary retention 03/31/2021 1615 by Camillia Herter, RN Outcome: Progressing 03/31/2021 1114 by Camillia Herter, RN Outcome: Progressing   Problem: Pain Managment: Goal: General experience of comfort will improve 03/31/2021 1615 by Camillia Herter, RN Outcome: Progressing 03/31/2021 1114 by Julius Bowels A, RN Outcome: Progressing   Problem: Safety: Goal: Ability to remain free from injury will improve 03/31/2021 1615 by Camillia Herter, RN Outcome: Progressing 03/31/2021 1114 by Julius Bowels A, RN Outcome: Progressing   Problem: Skin Integrity: Goal: Risk for impaired skin integrity will decrease 03/31/2021 1615 by Camillia Herter, RN Outcome: Progressing 03/31/2021 1114 by Camillia Herter, RN Outcome: Progressing

## 2021-03-31 NOTE — Progress Notes (Signed)
Claudia Acevedo to be D/C'd  per MD order. Discussed with the patient and all questions fully answered.  VSS, Skin clean, dry and intact without evidence of skin break down, no evidence of skin tears noted.  IV catheter discontinued intact. Site without signs and symptoms of complications. Dressing and pressure applied.  An After Visit Summary was printed and given to the patient. Patient received prescription.  D/c education completed with patient/family including follow up instructions, medication list, d/c activities limitations if indicated, with other d/c instructions as indicated by MD - patient able to verbalize understanding, all questions fully answered.   Patient instructed to return to ED, call 911, or call MD for any changes in condition.   Patient to be escorted via [place], and D/C home via private auto.

## 2021-03-31 NOTE — Plan of Care (Signed)
Problem: Education: Goal: Knowledge of General Education information will improve Description: Including pain rating scale, medication(s)/side effects and non-pharmacologic comfort measures 03/31/2021 1616 by Camillia Herter, RN Outcome: Adequate for Discharge 03/31/2021 1615 by Camillia Herter, RN Outcome: Progressing 03/31/2021 1114 by Camillia Herter, RN Outcome: Progressing   Problem: Health Behavior/Discharge Planning: Goal: Ability to manage health-related needs will improve 03/31/2021 1616 by Camillia Herter, RN Outcome: Adequate for Discharge 03/31/2021 1615 by Camillia Herter, RN Outcome: Progressing 03/31/2021 1114 by Camillia Herter, RN Outcome: Progressing   Problem: Clinical Measurements: Goal: Ability to maintain clinical measurements within normal limits will improve 03/31/2021 1616 by Camillia Herter, RN Outcome: Adequate for Discharge 03/31/2021 1615 by Camillia Herter, RN Outcome: Progressing 03/31/2021 1114 by Camillia Herter, RN Outcome: Progressing Goal: Will remain free from infection 03/31/2021 1616 by Camillia Herter, RN Outcome: Adequate for Discharge 03/31/2021 1615 by Camillia Herter, RN Outcome: Progressing 03/31/2021 1114 by Camillia Herter, RN Outcome: Progressing Goal: Diagnostic test results will improve 03/31/2021 1616 by Camillia Herter, RN Outcome: Adequate for Discharge 03/31/2021 1615 by Camillia Herter, RN Outcome: Progressing 03/31/2021 1114 by Camillia Herter, RN Outcome: Progressing Goal: Respiratory complications will improve 03/31/2021 1616 by Camillia Herter, RN Outcome: Adequate for Discharge 03/31/2021 1615 by Camillia Herter, RN Outcome: Progressing 03/31/2021 1114 by Julius Bowels A, RN Outcome: Progressing Goal: Cardiovascular complication will be avoided 03/31/2021 1616 by Camillia Herter, RN Outcome: Adequate for Discharge 03/31/2021 1615 by Camillia Herter, RN Outcome: Progressing 03/31/2021 1114 by Camillia Herter, RN Outcome:  Progressing   Problem: Activity: Goal: Risk for activity intolerance will decrease 03/31/2021 1616 by Camillia Herter, RN Outcome: Adequate for Discharge 03/31/2021 1615 by Camillia Herter, RN Outcome: Progressing 03/31/2021 1114 by Camillia Herter, RN Outcome: Progressing   Problem: Nutrition: Goal: Adequate nutrition will be maintained 03/31/2021 1616 by Camillia Herter, RN Outcome: Adequate for Discharge 03/31/2021 1615 by Camillia Herter, RN Outcome: Progressing 03/31/2021 1114 by Camillia Herter, RN Outcome: Progressing   Problem: Coping: Goal: Level of anxiety will decrease 03/31/2021 1616 by Camillia Herter, RN Outcome: Adequate for Discharge 03/31/2021 1615 by Camillia Herter, RN Outcome: Progressing 03/31/2021 1114 by Camillia Herter, RN Outcome: Progressing   Problem: Elimination: Goal: Will not experience complications related to bowel motility 03/31/2021 1616 by Camillia Herter, RN Outcome: Adequate for Discharge 03/31/2021 1615 by Camillia Herter, RN Outcome: Progressing 03/31/2021 1114 by Camillia Herter, RN Outcome: Progressing Goal: Will not experience complications related to urinary retention 03/31/2021 1616 by Camillia Herter, RN Outcome: Adequate for Discharge 03/31/2021 1615 by Camillia Herter, RN Outcome: Progressing 03/31/2021 1114 by Camillia Herter, RN Outcome: Progressing   Problem: Pain Managment: Goal: General experience of comfort will improve 03/31/2021 1616 by Camillia Herter, RN Outcome: Adequate for Discharge 03/31/2021 1615 by Camillia Herter, RN Outcome: Progressing 03/31/2021 1114 by Camillia Herter, RN Outcome: Progressing   Problem: Safety: Goal: Ability to remain free from injury will improve 03/31/2021 1616 by Camillia Herter, RN Outcome: Adequate for Discharge 03/31/2021 1615 by Camillia Herter, RN Outcome: Progressing 03/31/2021 1114 by Julius Bowels A, RN Outcome: Progressing   Problem: Skin Integrity: Goal: Risk for impaired skin  integrity will decrease 03/31/2021 1616 by Camillia Herter, RN Outcome: Adequate for Discharge 03/31/2021 1615 by Camillia Herter, RN Outcome: Progressing 03/31/2021 1114 by Camillia Herter, RN  Outcome: Progressing   

## 2021-03-31 NOTE — Progress Notes (Signed)
Subjective: No issues overnight. Chyle leak has significantly decresed  Objective: Vital signs in last 24 hours: Temp:  [98.4 F (36.9 C)-98.6 F (37 C)] 98.4 F (36.9 C) (06/03 0503) Pulse Rate:  [72-79] 72 (06/03 0503) Resp:  [17-18] 18 (06/03 0503) BP: (131-138)/(86-93) 131/86 (06/03 0503) SpO2:  [96 %-98 %] 97 % (06/03 0503)  Neck incision c/d/i. JP output is mostly serosanguinous. 72ml last 12 hours.  No results for input(s): WBC, HGB, HCT, PLT in the last 72 hours. Recent Labs    03/29/21 2147 03/30/21 0609  CALCIUM 9.1 9.3    Medications:  I have reviewed the patient's current medications. Scheduled: . atorvastatin  20 mg Oral QHS  . octreotide  100 mcg Subcutaneous Q8H   Continuous: . dextrose 5 % and 0.45 % NaCl with KCl 20 mEq/L 125 mL/hr at 03/31/21 7169    Assessment/Plan: POD 1 s/p total thyroidectomy and left neck dissection - Chyle leal has significantly decreased. - Continue octreotide. - Start low fat diet. - Will remove JP later today if she continues to do well.   LOS: 0 days   Trevonte Ashkar W Vennessa Affinito 03/31/2021, 8:22 AM

## 2021-03-31 NOTE — Plan of Care (Signed)

## 2021-03-31 NOTE — Progress Notes (Signed)
Minimal JP output since this AM.  Pt tolerated lunch without difficulty.  JP removed.  Will d/c home

## 2021-03-31 NOTE — Discharge Summary (Signed)
Physician Discharge Summary  Patient ID: Claudia Acevedo MRN: [id number] DOB/AGE: 1965/12/26 55 y.o.  Admit date: 03/29/2021 Discharge date: 03/31/2021  Admission Diagnoses: Thyroid cancer  Discharge Diagnoses: Thyroid cancer Active Problems:   S/P total thyroidectomy   History of total thyroidectomy with radical neck dissection   Discharged Condition: good  Hospital Course: Pt developed chyle leak from the JP drain on POD#1. She was started on octreotide injection TID. She was started on low fat / MCT diet, with significant decrease in her JP output.  Her voice is strong. Calcium is stable.  Consults: None  Significant Diagnostic Studies: None  Treatments: surgery: Total thyroidectomy and left neck dissection  Discharge Exam: Blood pressure 128/84, pulse 92, temperature 99.3 F (37.4 C), temperature source Oral, resp. rate 16, height 5\' 8"  (1.727 m), weight 96.3 kg, SpO2 98 %. Incision/Wound:c/d/i Voice is strong.  Disposition: Discharge disposition: 01-Home or Self Care       Discharge Instructions    Activity as tolerated - No restrictions   Complete by: As directed    Diet general   Complete by: As directed    Low fat diet - as little fat as possible for 1 week.   No wound care   Complete by: As directed      Allergies as of 03/31/2021   No Known Allergies     Medication List    TAKE these medications   atorvastatin 20 MG tablet Commonly known as: LIPITOR Take 20 mg by mouth at bedtime.   clindamycin 300 MG capsule Commonly known as: CLEOCIN Take 1 capsule (300 mg total) by mouth 3 (three) times daily for 5 days.   levothyroxine 100 MCG tablet Commonly known as: Synthroid Take 1 tablet (100 mcg total) by mouth daily before breakfast.   octreotide 100 MCG/ML Soln injection Commonly known as: SANDOSTATIN Inject 1 mL (100 mcg total) into the skin every 8 (eight) hours for 5 days.   oxyCODONE-acetaminophen 5-325 MG tablet Commonly known as: Percocet Take  1 tablet by mouth every 4 (four) hours as needed for up to 2 days for severe pain.       Follow-up Information    Leta Baptist, MD On 04/05/2021.   Specialty: Otolaryngology Why: at [facility] information: [address] [place] [address] [phone]               Signed: Burley Saver 03/31/2021, 4:06 PM

## 2021-03-31 NOTE — Discharge Instructions (Signed)
Thyroidectomy, Care After This sheet gives you information about how to care for yourself after your procedure. Your health care provider may also give you more specific instructions. If you have problems or questions, contact your health care provider. What can I expect after the procedure? After the procedure, it is common to have:  Mild pain in the neck or upper body, especially when swallowing.  A swollen neck.  A sore throat.  A weak or hoarse voice.  Slight tingling or numbness around your mouth, or in your fingers or toes. This may last for a day or two after surgery. This condition is caused by low levels of calcium. You may be given calcium supplements to treat it. Follow these instructions at home: Medicines  Take over-the-counter and prescription medicines only as told by your health care provider.  Do not drive or use heavy machinery while taking prescription pain medicine.  Do not take medicines that contain aspirin and ibuprofen until your health care provider says that you can. These medicines can increase your risk of bleeding.  Take a thyroid hormone medicine as recommended by your health care provider. You will have to take this medicine for the rest of your life if your entire thyroid was removed. Eating and drinking  Start slowly with eating. You may need to have only liquids and soft foods for a few days or as directed by your health care provider.  To prevent or treat constipation while you are taking prescription pain medicine, your health care provider may recommend that you: ? Drink enough fluid to keep your urine pale yellow. ? Take over-the-counter or prescription medicines. ? Eat foods that are high in fiber, such as fresh fruits and vegetables, whole grains, and beans. ? Limit foods that are high in fat Incision care  Follow instructions from your health care provider about how to take care of your incision. Make sure you: ? Wash your hands with soap  and water before you change your bandage (dressing). If soap and water are not available, use hand sanitizer. ? Change your dressing as told by your health care provider. ? Leave stitches (sutures), skin glue, or adhesive strips in place. These skin closures may need to stay in place for 2 weeks or longer. If adhesive strip edges start to loosen and curl up, you may trim the loose edges. Do not remove adhesive strips completely unless your health care provider tells you to do that.  Check your incision area every day for signs of infection. Check for: ? Redness, swelling, or pain. ? Fluid or blood. ? Warmth. ? Pus or a bad smell.  Activity  For the first 10 days after the procedure or as instructed by your health care provider: ? Do not lift anything that is heavier than 10 lb (4.5 kg). ? Do not jog, swim, or do other strenuous exercises. ? Do not play contact sports.  Avoid sitting for a long time without moving. Get up to take short walks every 1-2 hours. This is needed to improve blood flow and breathing. Ask for help if you feel weak or unsteady.  Return to your normal activities as told by your health care provider. Ask your health care provider what activities are safe for you. General instructions  Do not use any products that contain nicotine or tobacco, such as cigarettes and e-cigarettes. These can delay healing after surgery. If you need help quitting, ask your health care provider.  Keep all follow-up visits as told  by your health care provider. This is important. Your health care provider needs to monitor the calcium level in your blood to make sure that it does not become low.   Contact a health care provider if you:  Have a fever.  Have more redness, swelling, or pain around your incision area.  Have fluid or blood coming from your incision area.  Notice that your incision area feels warm to the touch.  Have pus or a bad smell coming from your incision area.  Have  trouble talking.  Have nausea or vomiting for more than 2 days. Get help right away if you:  Have trouble breathing.  Have trouble swallowing.  Develop a rash.  Develop a cough that gets worse.  Notice that your speech changes, or you have hoarseness that gets worse.  Develop numbness, tingling, or muscle spasms in the arms, hands, feet, or face. Summary  After the procedure, it is common to feel mild pain in the neck or upper body, especially when swallowing.  Take medicines as told by your health care provider. These include pain medicines and thyroid hormones, if required.  Follow instructions from your health care provider about how to take care of your incision. Watch for signs of infection.  Keep all follow-up visits as told by your health care provider. This is important. Your health care provider needs to monitor the calcium level in your blood to make sure that it does not become low.  Get help right away if you develop difficulty breathing, or numbness, tingling, or muscle spasms in the arms, hands, feet, or face. This information is not intended to replace advice given to you by your health care provider. Make sure you discuss any questions you have with your health care provider. Document Revised: 06/23/2020 Document Reviewed: 06/23/2020 Elsevier Patient Education  [place].

## 2021-04-11 ENCOUNTER — Other Ambulatory Visit (INDEPENDENT_AMBULATORY_CARE_PROVIDER_SITE_OTHER): Payer: Self-pay | Admitting: Otolaryngology

## 2021-04-11 LAB — TSH: TSH: 3.2 (ref 0.41–5.90)

## 2021-04-12 LAB — CALCIUM: Calcium: 9.5 mg/dL (ref 8.7–10.2)

## 2021-04-12 LAB — T4, FREE: Free T4: 1.71 ng/dL (ref 0.82–1.77)

## 2021-04-12 LAB — TSH: TSH: 3.21 u[IU]/mL (ref 0.450–4.500)

## 2021-04-13 ENCOUNTER — Encounter: Payer: Self-pay | Admitting: "Endocrinology

## 2021-04-13 ENCOUNTER — Ambulatory Visit: Payer: BC Managed Care – PPO | Admitting: "Endocrinology

## 2021-04-13 ENCOUNTER — Other Ambulatory Visit: Payer: Self-pay

## 2021-04-13 VITALS — BP 111/75 | HR 67 | Ht 68.0 in | Wt 207.6 lb

## 2021-04-13 DIAGNOSIS — C73 Malignant neoplasm of thyroid gland: Secondary | ICD-10-CM | POA: Diagnosis not present

## 2021-04-13 DIAGNOSIS — E890 Postprocedural hypothyroidism: Secondary | ICD-10-CM

## 2021-04-13 MED ORDER — LEVOTHYROXINE SODIUM 100 MCG PO TABS: 100.0000 ug | ORAL_TABLET | Freq: Every day | ORAL | 1 refills | Status: DC

## 2021-04-13 NOTE — Progress Notes (Signed)
Endocrinology Consult Note                                            04/13/2021, 7:18 PM   Subjective:    Patient ID: Claudia Acevedo, female    DOB: 1966-06-06, PCP Asencion Noble, MD   Past Medical History:  Diagnosis Date   GERD (gastroesophageal reflux disease)    Hyperlipidemia 10/2020   Thyroid cancer [facility])    Thyroid disease    Past Surgical History:  Procedure Laterality Date   BREAST SURGERY  03/1986   lft fib removed from left breast   EYE SURGERY Bilateral 2000-2001   laser eye surgery for nearsightedness   LIPOMA EXCISION Left 10/19/2013   Procedure: EXCISION THIGH LIPOMA;  Surgeon: Merrie Roof, MD;  Location: [facility];  Service: General;  Laterality: Left;   THYROIDECTOMY Left 03/29/2021   Procedure: TOTAL THYROIDECTOMY WITH LEFT NECK DISSECTION;  Surgeon: Leta Baptist, MD;  Location: [facility];  Service: ENT;  Laterality: Left;   WISDOM TOOTH EXTRACTION  2004   Social History   Socioeconomic History   Marital status: Married    Spouse name: Not on file   Number of children: Not on file   Years of education: Not on file   Highest education level: Not on file  Occupational History   Not on file  Tobacco Use   Smoking status: Never   Smokeless tobacco: Never  Vaping Use   Vaping Use: Never used  Substance and Sexual Activity   Alcohol use: Yes    Comment: twice month   Drug use: No   Sexual activity: Yes    Birth control/protection: I.U.D.  Other Topics Concern   Not on file  Social History Narrative   Not on file   Social Determinants of Health   Financial Resource Strain: Not on file  Food Insecurity: Not on file  Transportation Needs: Not on file  Physical Activity: Not on file  Stress: Not on file  Social Connections: Not on file   Family History  Problem Relation Age of Onset   Cancer Mother    Thyroid disease Mother    Hyperlipidemia Mother    Hypertension Mother    ALS Father    Outpatient Encounter Medications as  of 04/13/2021  Medication Sig   atorvastatin (LIPITOR) 20 MG tablet Take 20 mg by mouth at bedtime.   levothyroxine (SYNTHROID) 100 MCG tablet Take 1 tablet (100 mcg total) by mouth daily before breakfast.   [DISCONTINUED] levothyroxine (SYNTHROID) 100 MCG tablet Take 1 tablet (100 mcg total) by mouth daily before breakfast.   No facility-administered encounter medications on file as of 04/13/2021.   ALLERGIES: No Known Allergies  VACCINATION STATUS:  There is no immunization history on file for this patient.  HPI Claudia Acevedo is 55 y.o. female who presents today with a medical history as above. she is being seen in consultation for multifocal papillary thyroid cancer requested by Asencion Noble, MD.  She is accompanied by her husband to clinic.  History is obtained directly from the patient as well as chart review.    While she was undergoing work-up for right-sided neck lipoma she was found to have incidental nodules on the left lobe of her thyroid.  Subsequent ultrasound confirmed multinodular goiter for which she underwent fine-needle aspiration on Mar 01, 2019.  This biopsy showed papillary thyroid carcinoma category 5.  She underwent total thyroidectomy and deep neck dissection on left side On March 29, 2021 by Dr. Benjamine Mola. Surgical sample showed left-sided thyroid malignancy measuring 1.4 cm and 0.2 cm, positive margins, 2 out of 11 lymph nodes positive. Pathology classification  mpT1B,PN1b. She was put on levothyroxine 100 mcg p.o. daily following her surgery.  She is compliant and consistent.  Her subsequent thyroid function tests were consistent with appropriate replacement and normal calcium of 9.3.  She denies any family history of thyroid malignancy.  She denies any significant neck radiation.  She has well-controlled hyperlipidemia.  She had some thyroid dysfunction in her mother. She did not have significant goiter prior to her surgery or diagnosed with thyroid cancer.  She has previous  lipoma surgery in 2014. She denies palpitations, tremors, heat intolerance.  She does have some mild degree of hot flashes. Is recovering from her surgery very well.  Review of Systems  Constitutional: + Minimally fluctuating body weight, no fatigue, no subjective hyperthermia, no subjective hypothermia Eyes: no blurry vision, no xerophthalmia ENT: no sore throat, no nodules palpated in throat, no dysphagia/odynophagia, no hoarseness Cardiovascular: no Chest Pain, no Shortness of Breath, no palpitations, no leg swelling Respiratory: no cough, no shortness of breath Gastrointestinal: no Nausea/Vomiting/Diarhhea Musculoskeletal: no muscle/joint aches Skin: no rashes Neurological: no tremors, no numbness, no tingling, no dizziness Psychiatric: no depression, no anxiety  Objective:    Vitals with BMI 04/13/2021 03/31/2021 03/31/2021  Height 5\' 8"  - -  Weight 207 lbs 10 oz - -  BMI 29.47 - -  Systolic 654 650 354  Diastolic 75 84 86  Pulse 67 92 72    BP 111/75   Pulse 67   Ht 5\' 8"  (1.727 m)   Wt 207 lb 9.6 oz (94.2 kg)   BMI 31.57 kg/m   Wt Readings from Last 3 Encounters:  04/13/21 207 lb 9.6 oz (94.2 kg)  03/29/21 212 lb 4.9 oz (96.3 kg)  03/28/21 212 lb 6 oz (96.3 kg)    Physical Exam  Constitutional:  Body mass index is 31.57 kg/m.,  not in acute distress, normal state of mind Eyes: PERRLA, EOMI, no exophthalmos ENT: moist mucous membranes, + still healing long surgical scar on left and lower anterior neck, no gross cervical lymphadenopathy Cardiovascular: normal precordial activity, Regular Rate and Rhythm, no Murmur/Rubs/Gallops Respiratory:  adequate breathing efforts, no gross chest deformity, Clear to auscultation bilaterally Gastrointestinal: abdomen soft, Non -tender, No distension, Bowel Sounds present, no gross organomegaly Musculoskeletal: no gross deformities, strength intact in all four extremities Skin: moist, warm, no rashes Neurological: no tremor with  outstretched hands, Deep tendon reflexes normal in bilateral lower extremities.  CMP ( most recent) CMP     Component Value Date/Time   NA 140 02/28/2021 1147   K 3.9 02/28/2021 1147   CL 105 02/28/2021 1147   CO2 26 02/28/2021 1147   GLUCOSE 102 (H) 02/28/2021 1147   BUN 22 (H) 02/28/2021 1147   CREATININE 0.92 02/28/2021 1147   CALCIUM 9.3 03/30/2021 0609   GFRNONAA >60 02/28/2021 1147   GFRAA >60 11/06/2018 0200    Results for JERALINE, MARCINEK (MRN [id number]) as of 04/13/2021 19:25  Ref. Range 03/30/2021 06:09 04/11/2021 00:00  Calcium Latest Ref Range: 8.9 - 10.3 mg/dL 9.3   TSH Latest Ref Range: 0.41 - 5.90   3.20   Biopsy and surgical pathology findings are reviewed.   Assessment & Plan:  1. Postsurgical hypothyroidism 2. Malignant neoplasm of thyroid gland ([place])  - Kayanna Mckillop  is being seen at a kind request of Asencion Noble, MD. - I have reviewed her available thyroid records and clinically evaluated the patient. - Based on these reviews, she was recently diagnosed with follicular variant papillary thyroid cancer multifocal, locally invasive including metastases to lymph nodes 2 out of 11 in the left cervical region status post total thyroidectomy and left neck dissection.   Considering pathology classification mpT1B,PN1b, multifocal nature of malignancy, relative youth of the patient, putting her at intermediate risk of tumor recurrence, she will need adjuvant therapy with I-131 to ablate thyroid remnants.  I explained this procedure to the patient and she is in agreement. Treatment will be facilitated by Thyrogen injection preceding dosing of I-131 followed by posttherapy whole-body scan.  This will be performed in [facility] nuclear medicine starting from at least 30 days from her date of surgery- after May 02, 2021.  I discussed isolation briefly.  She will follow nuclear medicine isolation protocols.   She will also have labs for thyroglobulin/thyroglobulin  antibodies on the day of I-131 dosing.  She will return in mid August with lab work and whole-body scan results to discuss and plan for future surveillance.  I had a discussion about the need for surveillance imaging studies for the next 5 years to assure tumor remission, and he has chance of catching any potential tumor recurrence.  Regarding postsurgical hypothyroidism: Her most recent thyroid function tests from April 11, 2021 are consistent with appropriate suppressive therapy. She is advised to continue her current dose of levothyroxine 100 mcg p.o. daily before breakfast.   - We discussed about the correct intake of her thyroid hormone, on empty stomach at fasting, with water, separated by at least 30 minutes from breakfast and other medications,  and separated by more than 4 hours from calcium, iron, multivitamins, acid reflux medications (PPIs). -Patient is made aware of the fact that thyroid hormone replacement is needed for life, dose to be adjusted by periodic monitoring of thyroid function tests.  Her recent labs show normal calcium of 9.3, no need for calcium supplements.  - she is advised to maintain close follow up with Asencion Noble, MD for primary care needs.   - Time spent with the patient: 60 minutes, of which >50% was spent in  counseling her about her multifocal, locally invasive follicular variant papillary thyroid cancer and postsurgical hypothyroidism and the rest in obtaining information about her symptoms, reviewing her previous labs/studies ( including abstractions from other facilities),  evaluations, and treatments,  and developing a plan to confirm diagnosis and long term treatment based on the latest standards of care/guidelines; and documenting her care.  Rilynne Lonsway participated in the discussions, expressed understanding, and voiced agreement with the above plans.  All questions were answered to her satisfaction. she is encouraged to contact clinic should she have  any questions or concerns prior to her return visit.  Follow up plan: Return in about 8 weeks (around 06/08/2021) for F/U with Pre-visit Labs, F/U with Whole Body Scan w/Thyrogen.   Glade Lloyd, MD [facility] [address] [place], [place] [address] Phone: [phone]  Fax: [phone]     04/13/2021, 7:18 PM  This note was partially dictated with voice recognition software. Similar sounding words can be transcribed inadequately or may not  be corrected upon review.

## 2021-05-10 ENCOUNTER — Encounter (HOSPITAL_COMMUNITY): Payer: Self-pay

## 2021-05-10 ENCOUNTER — Other Ambulatory Visit: Payer: Self-pay

## 2021-05-10 ENCOUNTER — Ambulatory Visit (HOSPITAL_COMMUNITY)
Admission: RE | Admit: 2021-05-10 | Discharge: 2021-05-10 | Disposition: A | Discharge status: Home / Self Care | Payer: BC Managed Care – PPO | Source: Ambulatory Visit | Attending: "Endocrinology | Admitting: "Endocrinology

## 2021-05-10 DIAGNOSIS — C73 Malignant neoplasm of thyroid gland: Secondary | ICD-10-CM | POA: Diagnosis present

## 2021-05-10 DIAGNOSIS — E890 Postprocedural hypothyroidism: Secondary | ICD-10-CM | POA: Diagnosis not present

## 2021-05-10 MED ORDER — STERILE WATER FOR INJECTION IJ SOLN
INTRAMUSCULAR | Status: AC
  Administered 2021-05-10: 1.2 mL
  Filled 2021-05-10: qty 10

## 2021-05-10 MED ORDER — THYROTROPIN ALFA 0.9 MG IM SOLR: 0.9000 mg | INTRAMUSCULAR | Status: AC

## 2021-05-10 MED ORDER — THYROTROPIN ALFA 0.9 MG IM SOLR
INTRAMUSCULAR | Status: AC
  Administered 2021-05-10: 0.9 mg via INTRAMUSCULAR
  Filled 2021-05-10: qty 0.9

## 2021-05-11 ENCOUNTER — Encounter (HOSPITAL_COMMUNITY): Payer: Self-pay

## 2021-05-11 ENCOUNTER — Encounter (HOSPITAL_COMMUNITY)
Admission: RE | Admit: 2021-05-11 | Discharge: 2021-05-11 | Disposition: A | Discharge status: Home / Self Care | Payer: BC Managed Care – PPO | Source: Ambulatory Visit | Attending: "Endocrinology | Admitting: "Endocrinology

## 2021-05-11 DIAGNOSIS — E890 Postprocedural hypothyroidism: Secondary | ICD-10-CM | POA: Diagnosis not present

## 2021-05-11 DIAGNOSIS — C73 Malignant neoplasm of thyroid gland: Secondary | ICD-10-CM

## 2021-05-11 MED ORDER — THYROTROPIN ALFA 0.9 MG IM SOLR
INTRAMUSCULAR | Status: AC
  Filled 2021-05-11: qty 0.9

## 2021-05-11 MED ORDER — THYROTROPIN ALFA 0.9 MG IM SOLR: 0.9000 mg | INTRAMUSCULAR | Status: AC

## 2021-05-11 MED ORDER — STERILE WATER FOR INJECTION IJ SOLN
INTRAMUSCULAR | Status: AC
  Filled 2021-05-11: qty 10

## 2021-05-11 MED ORDER — THYROTROPIN ALFA 0.9 MG IM SOLR
INTRAMUSCULAR | Status: AC
  Administered 2021-05-11: 0.9 mg via INTRAMUSCULAR
  Filled 2021-05-11: qty 0.9

## 2021-05-12 ENCOUNTER — Ambulatory Visit (HOSPITAL_COMMUNITY)
Admission: RE | Admit: 2021-05-12 | Discharge: 2021-05-12 | Disposition: A | Discharge status: Home / Self Care | Payer: BC Managed Care – PPO | Source: Ambulatory Visit | Attending: "Endocrinology | Admitting: "Endocrinology

## 2021-05-12 ENCOUNTER — Other Ambulatory Visit (HOSPITAL_COMMUNITY)
Admission: RE | Admit: 2021-05-12 | Discharge: 2021-05-12 | Disposition: A | Discharge status: Home / Self Care | Payer: BC Managed Care – PPO | Source: Ambulatory Visit | Attending: "Endocrinology | Admitting: "Endocrinology

## 2021-05-12 ENCOUNTER — Other Ambulatory Visit: Payer: Self-pay

## 2021-05-12 ENCOUNTER — Encounter (HOSPITAL_COMMUNITY): Payer: Self-pay

## 2021-05-12 DIAGNOSIS — E890 Postprocedural hypothyroidism: Secondary | ICD-10-CM | POA: Insufficient documentation

## 2021-05-12 DIAGNOSIS — C73 Malignant neoplasm of thyroid gland: Secondary | ICD-10-CM | POA: Insufficient documentation

## 2021-05-12 LAB — TSH: TSH: 224.33 u[IU]/mL — ABNORMAL HIGH (ref 0.350–4.500)

## 2021-05-12 LAB — T4, FREE: Free T4: 1.29 ng/dL — ABNORMAL HIGH (ref 0.61–1.12)

## 2021-05-12 MED ORDER — SODIUM IODIDE I 131 CAPSULE
150.0000 | Freq: Once | INTRAVENOUS | Status: AC | PRN
  Administered 2021-05-12: 154.4 via ORAL

## 2021-05-13 LAB — THYROGLOBULIN ANTIBODY: Thyroglobulin Antibody: <1 IU/mL (ref 0.0–0.9)

## 2021-05-20 LAB — THYROGLOBULIN LEVEL: Thyroglobulin: 2.7 ng/mL

## 2021-05-22 ENCOUNTER — Other Ambulatory Visit: Payer: Self-pay

## 2021-05-22 ENCOUNTER — Ambulatory Visit (HOSPITAL_COMMUNITY)
Admission: RE | Admit: 2021-05-22 | Discharge: 2021-05-22 | Disposition: A | Discharge status: Home / Self Care | Payer: BC Managed Care – PPO | Source: Ambulatory Visit | Attending: "Endocrinology | Admitting: "Endocrinology

## 2021-05-22 DIAGNOSIS — E890 Postprocedural hypothyroidism: Secondary | ICD-10-CM | POA: Diagnosis not present

## 2021-05-22 DIAGNOSIS — C73 Malignant neoplasm of thyroid gland: Secondary | ICD-10-CM | POA: Diagnosis present

## 2021-06-05 ENCOUNTER — Other Ambulatory Visit: Payer: Self-pay

## 2021-06-05 ENCOUNTER — Encounter: Payer: Self-pay | Admitting: "Endocrinology

## 2021-06-05 ENCOUNTER — Ambulatory Visit (INDEPENDENT_AMBULATORY_CARE_PROVIDER_SITE_OTHER): Payer: BC Managed Care – PPO | Admitting: "Endocrinology

## 2021-06-05 VITALS — BP 106/80 | HR 72 | Ht 68.0 in | Wt 211.8 lb

## 2021-06-05 DIAGNOSIS — E890 Postprocedural hypothyroidism: Secondary | ICD-10-CM | POA: Diagnosis not present

## 2021-06-05 DIAGNOSIS — C73 Malignant neoplasm of thyroid gland: Secondary | ICD-10-CM

## 2021-06-05 MED ORDER — LEVOTHYROXINE SODIUM 100 MCG PO TABS: 100.0000 ug | ORAL_TABLET | Freq: Every day | ORAL | 1 refills | Status: DC

## 2021-06-05 NOTE — Progress Notes (Signed)
06/05/2021, 4:04 PM  Endocrinology follow-up note   Subjective:    Patient ID: Claudia Acevedo, female    DOB: 01-29-1966, PCP Asencion Noble, MD   Past Medical History:  Diagnosis Date   GERD (gastroesophageal reflux disease)    Hyperlipidemia 10/2020   Thyroid cancer [facility])    Thyroid disease    Past Surgical History:  Procedure Laterality Date   BREAST SURGERY  03/1986   lft fib removed from left breast   EYE SURGERY Bilateral 2000-2001   laser eye surgery for nearsightedness   LIPOMA EXCISION Left 10/19/2013   Procedure: EXCISION THIGH LIPOMA;  Surgeon: Merrie Roof, MD;  Location: [place];  Service: General;  Laterality: Left;   THYROIDECTOMY Left 03/29/2021   Procedure: TOTAL THYROIDECTOMY WITH LEFT NECK DISSECTION;  Surgeon: Leta Baptist, MD;  Location: [place];  Service: ENT;  Laterality: Left;   WISDOM TOOTH EXTRACTION  2004   Social History   Socioeconomic History   Marital status: Married    Spouse name: Not on file   Number of children: Not on file   Years of education: Not on file   Highest education level: Not on file  Occupational History   Not on file  Tobacco Use   Smoking status: Never   Smokeless tobacco: Never  Vaping Use   Vaping Use: Never used  Substance and Sexual Activity   Alcohol use: Yes    Comment: twice month   Drug use: No   Sexual activity: Yes    Birth control/protection: I.U.D.  Other Topics Concern   Not on file  Social History Narrative   Not on file   Social Determinants of Health   Financial Resource Strain: Not on file  Food Insecurity: Not on file  Transportation Needs: Not on file  Physical Activity: Not on file  Stress: Not on file  Social Connections: Not on file   Family History  Problem Relation Age of Onset   Cancer Mother    Thyroid disease Mother    Hyperlipidemia Mother    Hypertension Mother    ALS Father    Outpatient Encounter  Medications as of 06/05/2021  Medication Sig   atorvastatin (LIPITOR) 20 MG tablet Take 20 mg by mouth at bedtime.   levothyroxine (SYNTHROID) 100 MCG tablet Take 1 tablet (100 mcg total) by mouth daily before breakfast.   [DISCONTINUED] levothyroxine (SYNTHROID) 100 MCG tablet Take 1 tablet (100 mcg total) by mouth daily before breakfast.   No facility-administered encounter medications on file as of 06/05/2021.   ALLERGIES: No Known Allergies  VACCINATION STATUS:  There is no immunization history on file for this patient.  HPI Claudia Acevedo is 55 y.o. female who presents today with a medical history as above. she is being seen in follow-up after she was seen in consultation for multifocal papillary thyroid cancer requested by Asencion Noble, MD.  She is accompanied by her husband to clinic.  History is obtained directly from the patient as well as chart review.  See notes from last visit.  She is recovering from her recent thyroidectomy. -Fine-needle aspiration biopsy showed papillary thyroid carcinoma category 5.  She underwent total thyroidectomy and deep neck dissection  on left side On March 29, 2021 by Dr. Benjamine Mola. Surgical sample showed left-sided thyroid malignancy measuring 1.4 cm and 0.2 cm, positive margins, 2 out of 11 lymph nodes positive. Pathology classification  mpT1B,PN1b. She was put on levothyroxine 100 mcg p.o. daily following her surgery.  She is compliant with her medication.  She has no new complaints today.    -She recently underwent Thyrogen stimulated remnant ablation with only expected uptake in the neck, absent distant metastasis.  She denies any family history of thyroid malignancy.  She denies any significant neck radiation.  She has well-controlled hyperlipidemia.  She had some thyroid dysfunction in her mother. She did not have significant goiter prior to her surgery or diagnosed with thyroid cancer.  She has previous lipoma surgery in 2014. She denies palpitations,  tremors, heat intolerance.  She does have some mild degree of hot flashes. Is recovering from her surgery very well.  Review of Systems   Objective:    Vitals with BMI 06/05/2021 04/13/2021 03/31/2021  Height '5\' 8"'$  '5\' 8"'$  -  Weight 211 lbs 13 oz 207 lbs 10 oz -  BMI 0000000 [id number] -  Systolic [id number] 0000000  Diastolic 80 75 84  Pulse 72 67 92    BP 106/80   Pulse 72   Ht '5\' 8"'$  (1.727 m)   Wt 211 lb 12.8 oz (96.1 kg)   BMI 32.20 kg/m   Wt Readings from Last 3 Encounters:  06/05/21 211 lb 12.8 oz (96.1 kg)  04/13/21 207 lb 9.6 oz (94.2 kg)  03/29/21 212 lb 4.9 oz (96.3 kg)    Physical Exam  Constitutional:  Body mass index is 32.2 kg/m.,  not in acute distress, normal state of mind Eyes: PERRLA, EOMI, no exophthalmos ENT: moist mucous membranes, + still healing long surgical scar on left and lower anterior neck, no gross cervical lymphadenopathy -Up to 10 cm lipoma on right clavicular area.   CMP ( most recent) CMP     Component Value Date/Time   NA 140 02/28/2021 1147   K 3.9 02/28/2021 1147   CL 105 02/28/2021 1147   CO2 26 02/28/2021 1147   GLUCOSE 102 (H) 02/28/2021 1147   BUN 22 (H) 02/28/2021 1147   CREATININE 0.92 02/28/2021 1147   CALCIUM 9.5 04/11/2021 1558   GFRNONAA >60 02/28/2021 1147   GFRAA >60 11/06/2018 0200    Results for Acevedo, Claudia (MRN [id number]) as of 04/13/2021 19:25  Ref. Range 03/30/2021 06:09 04/11/2021 00:00  Calcium Latest Ref Range: 8.9 - 10.3 mg/dL 9.3   TSH Latest Ref Range: 0.41 - 5.90   3.20   Biopsy and surgical pathology findings are reviewed.  FINDINGS: Two small foci of mild to moderate uptake along the midline of the lower neck in the expected location of the thyroid bed are identified corresponding to expected residual functioning thyroid tissue. No signs of tracer avid nodal metastasis or distant metastatic disease.   IMPRESSION: Expected residual functioning thyroid tissue within the thyroid bed status post thyroidectomy. No  signs nodal metastasis or distant metastatic disease.   Assessment & Plan:   1. Postsurgical hypothyroidism 2. Malignant neoplasm of thyroid gland (HCC)  She is status post total thyroidectomy for recently diagnosed follicular variant papillary thyroid cancer multifocal, locally invasive including metastases to lymph nodes 2 out of 11 in the left cervical region status post total thyroidectomy and left neck dissection.   Considering pathology classification mpT1B,PN1b, multifocal nature of malignancy, relative youth of the patient, putting  her at intermediate risk of tumor recurrence, she was considered for I-131 thyroid remnant ablation with posttherapy scan showing expected uptake in the neck, no distant metastasis on May 22, 2021.   Her previsit labs show thyroglobulin detectable at 2.7, with undetectable antithyroglobulin antibodies.  She will be considered for thyroid/neck ultrasound in a year. I had a discussion about the need for surveillance imaging studies for the next 5 years to assure tumor remission, and he has chance of catching any potential tumor recurrence.  Regarding postsurgical hypothyroidism: Her most recent thyroid function tests from April 11, 2021 are still consistent with appropriate replacement.  She is advised to continue Synthroid 100 mcg p.o. daily before breakfast.     - We discussed about the correct intake of her thyroid hormone, on empty stomach at fasting, with water, separated by at least 30 minutes from breakfast and other medications,  and separated by more than 4 hours from calcium, iron, multivitamins, acid reflux medications (PPIs). -Patient is made aware of the fact that thyroid hormone replacement is needed for life, dose to be adjusted by periodic monitoring of thyroid function tests.  Her recent labs show normal calcium of 9.3, no need for calcium supplements.  - she is advised to maintain close follow up with Asencion Noble, MD for primary care  needs.   I spent 25 minutes in the care of the patient today including review of labs from Thyroid Function, CMP, and other relevant labs ; imaging/biopsy records (current and previous including abstractions from other facilities); face-to-face time discussing  her lab results and symptoms, medications doses, her options of short and long term treatment based on the latest standards of care / guidelines;   and documenting the encounter.  Melinah Catterton  participated in the discussions, expressed understanding, and voiced agreement with the above plans.  All questions were answered to her satisfaction. she is encouraged to contact clinic should she have any questions or concerns prior to her return visit.   Follow up plan: Return in about 6 months (around 12/06/2021) for F/U with Pre-visit Labs.   Glade Lloyd, MD [facility] [address] [place], [place] [address] Phone: [phone]  Fax: [phone]     06/05/2021, 4:04 PM  This note was partially dictated with voice recognition software. Similar sounding words can be transcribed inadequately or may not  be corrected upon review.

## 2021-06-08 IMAGING — US US THYROID
1 series · 13 of 25 positions shown · non-contrast
Comparison: CT 01/31/2021

CLINICAL DATA: Cervical adenopathy on CT neck.  Evaluate thyroid.

EXAM:
THYROID ULTRASOUND
TECHNIQUE: Ultrasound examination of the thyroid gland and adjacent soft
tissues was performed.

[Series 1: us thyroid · 112 acquisitions, 13 frames shown]
[im 1/112]
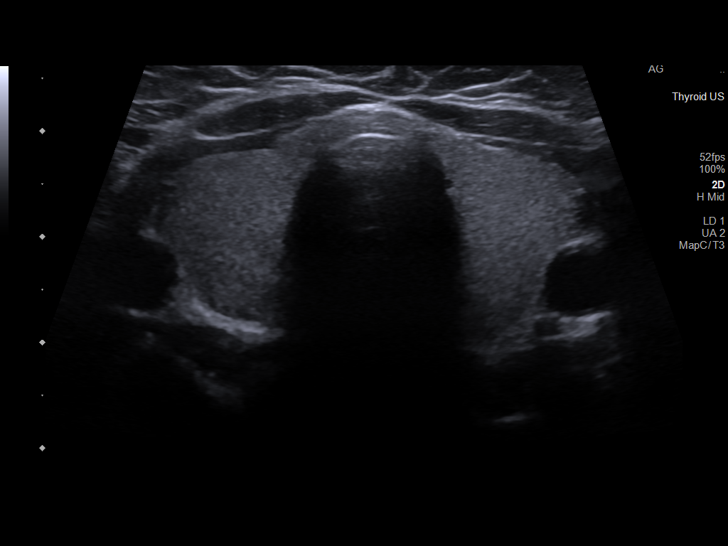
[im 10/112]
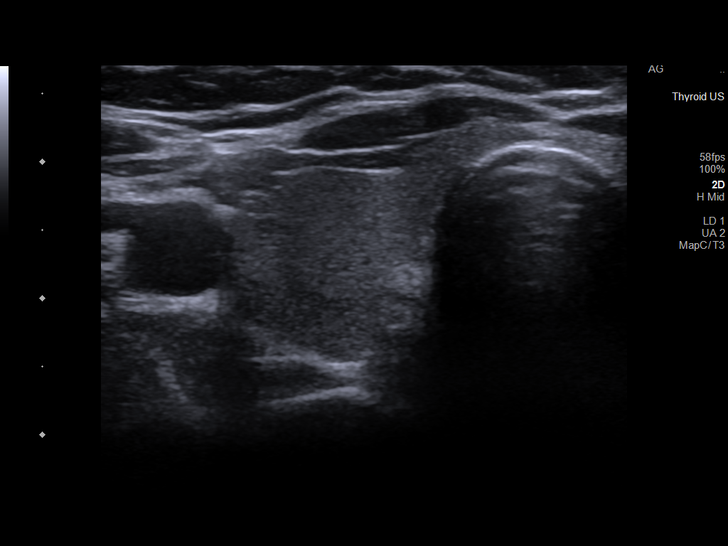
[im 19/112]
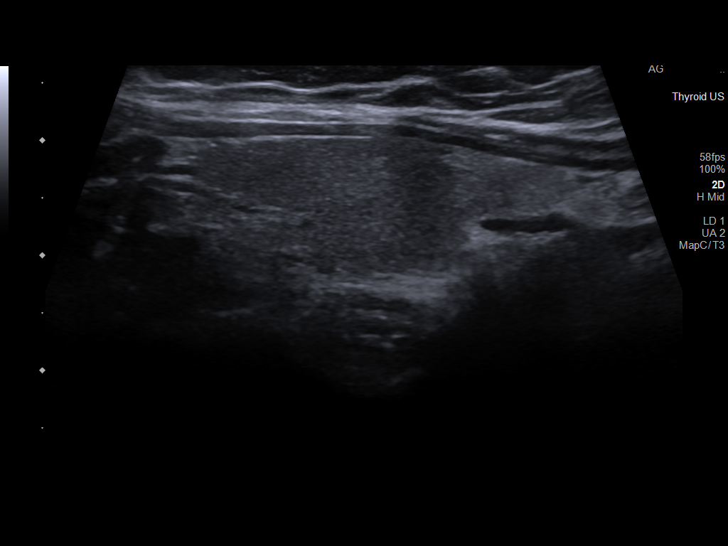
[im 28/112]
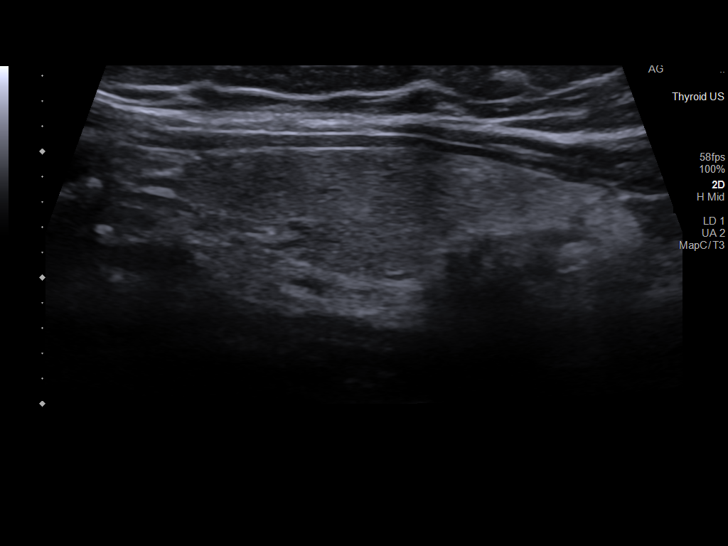
[im 38/112]
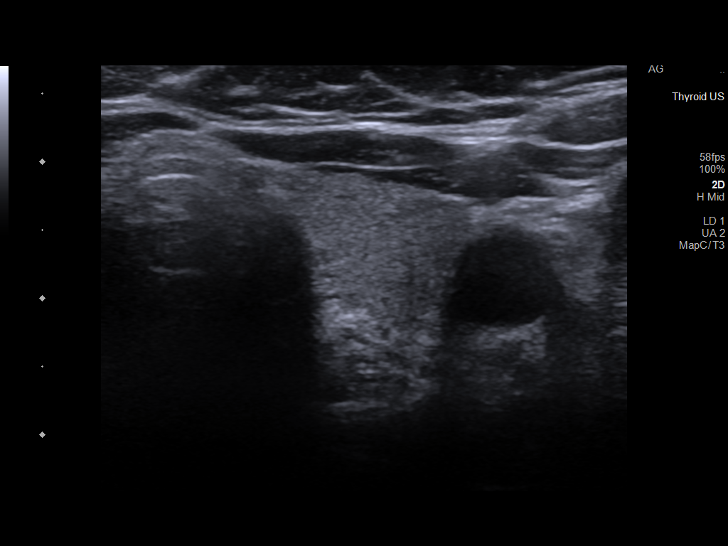
[im 47/112]
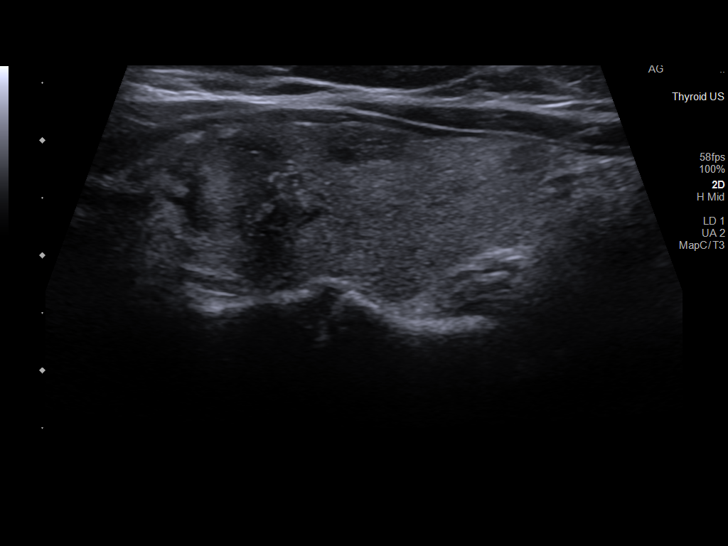
[im 56/112]
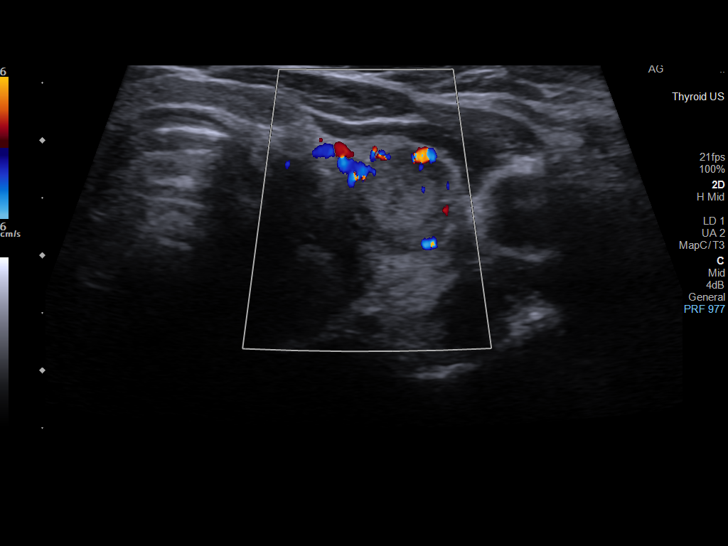
[im 65/112]
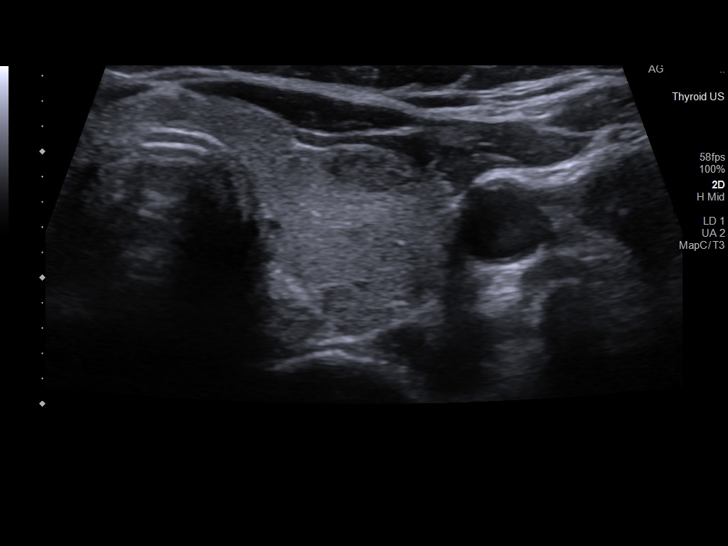
[im 75/112]
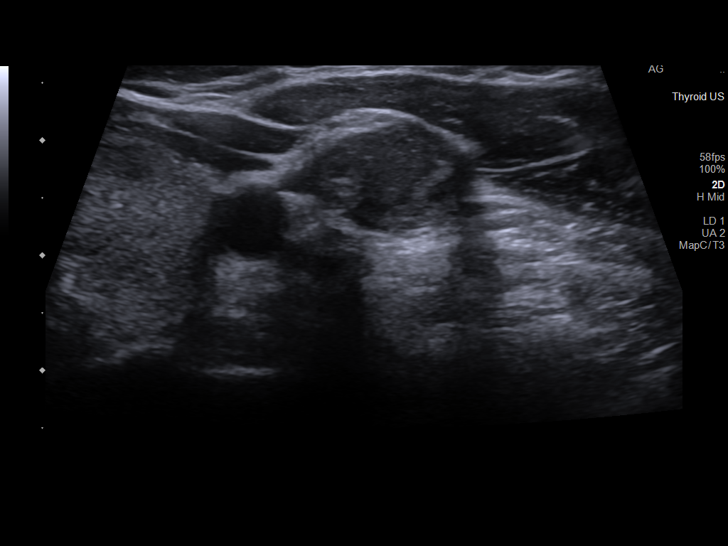
[im 84/112]
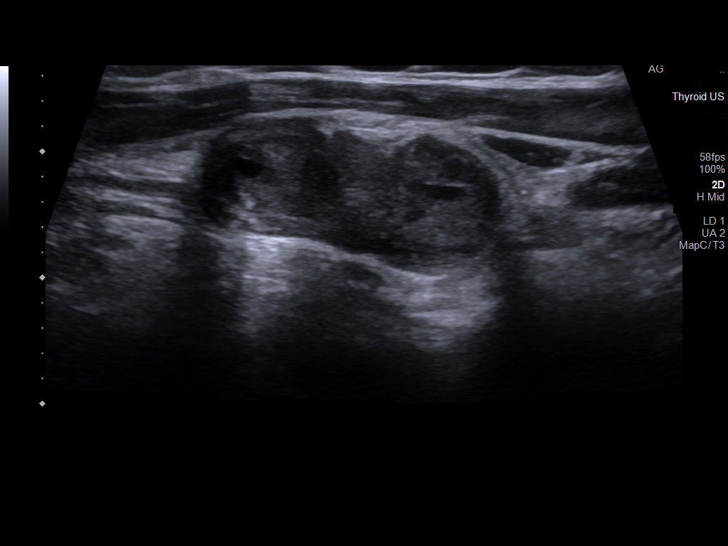
[im 93/112]
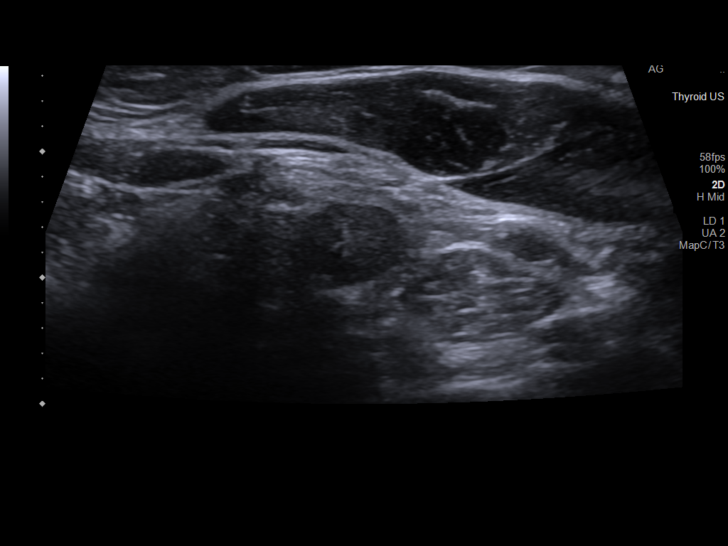
[im 102/112]
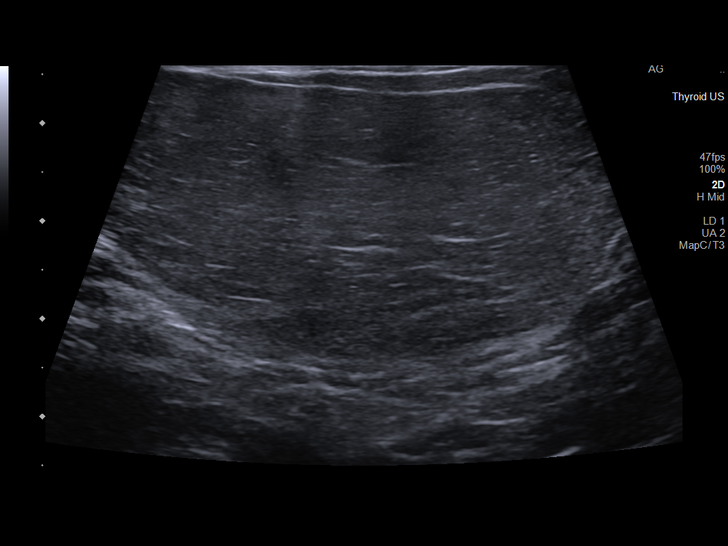
[im 112/112]
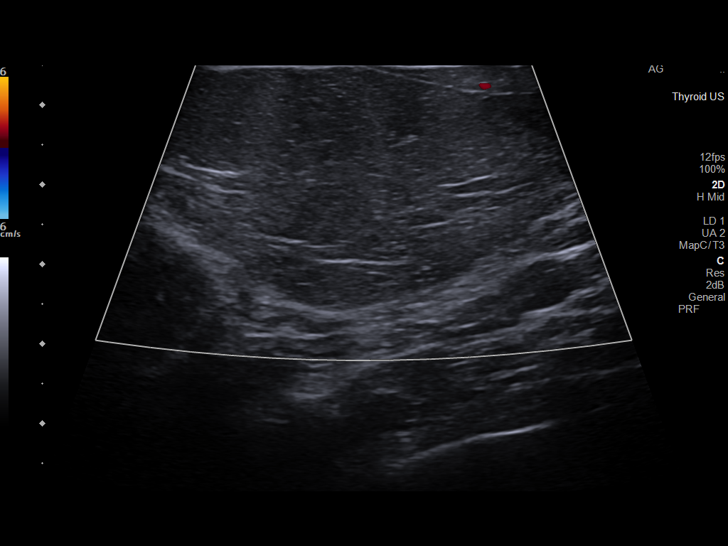

[13 of 25 positions shown; findings below may reference images not displayed]

FINDINGS: Parenchymal Echotexture: Mildly heterogenous

Isthmus: 0.2 cm thickness

Right lobe: 4.1 x 1.6 x 1.6 cm

Left lobe: 3.7 x 1.6 x 1.5 cm

_________________________________________________________

Estimated total number of nodules >/= 1 cm: 1

Number of spongiform nodules >/=  2 cm not described below (TR1): 0

Number of mixed cystic and solid nodules >/= 1.5 cm not described
below (TR2): 0

_________________________________________________________

Nodule # 1:

Location: Left; superior

Maximum size: 1.3 cm; Other 2 dimensions: 1 x 1 cm

Composition: solid/almost completely solid (2)

Echogenicity: hypoechoic (2)

Shape: taller-than-wide (3)

Margins: lobulated/irregular (2)

Echogenic foci: punctate echogenic foci (3)

ACR TI-RADS total points: 12.

ACR TI-RADS risk category: TR5 (>/= 7 points).

ACR TI-RADS recommendations:

**Given size (>/= 1.0 cm) and appearance, fine needle aspiration of
this highly suspicious nodule should be considered based on TI-RADS
criteria.

_________________________________________________________

Nodule # 2: 0.7 cm hypoechoic nodule without calcifications, mid
left; This nodule does NOT meet TI-RADS criteria for biopsy or
dedicated follow-up.

Abnormal hypoechoic left cervical lymph node measuring 1 cm short
axis diameter, and a smaller hypoechoic node without definite fatty
hilum measuring 0.8 cm. Homogeneous right supraclavicular mass
corresponding to lipoma seen on prior CT also noted.
IMPRESSION: 1. Normal-sized thyroid with left nodules.
2. Recommend FNA biopsy of highly suspicious 1.3 cm superior left
nodule, and concurrent FNA biopsy of regional adenopathy.

The above is in keeping with the ACR TI-RADS recommendations - [HOSPITAL] 8272;[DATE].

## 2021-07-31 NOTE — Telephone Encounter (Signed)
Message left

## 2021-10-10 ENCOUNTER — Other Ambulatory Visit: Payer: Self-pay

## 2021-10-10 ENCOUNTER — Ambulatory Visit (INDEPENDENT_AMBULATORY_CARE_PROVIDER_SITE_OTHER): Payer: BC Managed Care – PPO | Admitting: Plastic Surgery

## 2021-10-10 DIAGNOSIS — R221 Localized swelling, mass and lump, neck: Secondary | ICD-10-CM | POA: Diagnosis not present

## 2021-10-10 NOTE — Progress Notes (Signed)
° °  Subjective:    Patient ID: Claudia Acevedo, female    DOB: Apr 04, 1966, 55 y.o.   MRN: [id number]  The patient is a 55 year old female here for follow-up on her right clavicular mass.  She had a lesion for 10 years and it has the consistency of a lipoma.  However to be on the safe side I had a CT scan done.  The CT scan showed irregularities in her thyroid which ended up being a thyroid cancer.  The right mass was found to be be a 5.2 x 4.4 x 6.7 fatty mass in the right supraclavicular, likely a benign lipoma.  She underwent a thyroidectomy for thyroid cancer with lymph node dissection by Dr. Benjamine Mola June 2022. She did not have any problems with anesthesia.  She feels she is doing really well and has healed up.  She would like to have the mass removed from her right clavicle.  It seems to be a little bit more prominent than it was on her last exam.     Review of Systems  Constitutional: Negative.   HENT: Negative.    Eyes: Negative.   Respiratory: Negative.    Cardiovascular: Negative.   Gastrointestinal: Negative.   Endocrine: Negative.   Genitourinary: Negative.   Musculoskeletal: Negative.   Skin: Negative.   Neurological: Negative.   Hematological: Negative.   Psychiatric/Behavioral: Negative.        Objective:   Physical Exam Nursing note reviewed.  Constitutional:      Appearance: Normal appearance.  HENT:     Head: Normocephalic and atraumatic.  Cardiovascular:     Rate and Rhythm: Normal rate.     Pulses: Normal pulses.  Pulmonary:     Effort: Pulmonary effort is normal. No respiratory distress.  Musculoskeletal:        General: Swelling and deformity present.  Skin:    Capillary Refill: Capillary refill takes less than 2 seconds.     Coloration: Skin is not jaundiced.     Findings: No bruising.  Neurological:     Mental Status: She is alert and oriented to person, place, and time.  Psychiatric:        Mood and Affect: Mood normal.        Behavior: Behavior normal.         Thought Content: Thought content normal.       Assessment & Plan:     ICD-10-CM   1. Mass of neck  R22.1       Plan for removal of right clavicular lipoma.  Risks and complications were discussed including a scar.  Pictures were obtained of the patient and placed in the chart with the patient's or guardian's permission.

## 2021-10-10 NOTE — H&P (View-Only) (Signed)
° °  Subjective:    Patient ID: Claudia Acevedo, female    DOB: 08/03/1966, 55 y.o.   MRN: [id number]  The patient is a 55 year old female here for follow-up on her right clavicular mass.  She had a lesion for 10 years and it has the consistency of a lipoma.  However to be on the safe side I had a CT scan done.  The CT scan showed irregularities in her thyroid which ended up being a thyroid cancer.  The right mass was found to be be a 5.2 x 4.4 x 6.7 fatty mass in the right supraclavicular, likely a benign lipoma.  She underwent a thyroidectomy for thyroid cancer with lymph node dissection by Dr. Benjamine Mola June 2022. She did not have any problems with anesthesia.  She feels she is doing really well and has healed up.  She would like to have the mass removed from her right clavicle.  It seems to be a little bit more prominent than it was on her last exam.     Review of Systems  Constitutional: Negative.   HENT: Negative.    Eyes: Negative.   Respiratory: Negative.    Cardiovascular: Negative.   Gastrointestinal: Negative.   Endocrine: Negative.   Genitourinary: Negative.   Musculoskeletal: Negative.   Skin: Negative.   Neurological: Negative.   Hematological: Negative.   Psychiatric/Behavioral: Negative.        Objective:   Physical Exam Nursing note reviewed.  Constitutional:      Appearance: Normal appearance.  HENT:     Head: Normocephalic and atraumatic.  Cardiovascular:     Rate and Rhythm: Normal rate.     Pulses: Normal pulses.  Pulmonary:     Effort: Pulmonary effort is normal. No respiratory distress.  Musculoskeletal:        General: Swelling and deformity present.  Skin:    Capillary Refill: Capillary refill takes less than 2 seconds.     Coloration: Skin is not jaundiced.     Findings: No bruising.  Neurological:     Mental Status: She is alert and oriented to person, place, and time.  Psychiatric:        Mood and Affect: Mood normal.        Behavior: Behavior normal.         Thought Content: Thought content normal.       Assessment & Plan:     ICD-10-CM   1. Mass of neck  R22.1       Plan for removal of right clavicular lipoma.  Risks and complications were discussed including a scar.  Pictures were obtained of the patient and placed in the chart with the patient's or guardian's permission.

## 2021-11-01 ENCOUNTER — Encounter (HOSPITAL_BASED_OUTPATIENT_CLINIC_OR_DEPARTMENT_OTHER): Payer: Self-pay | Admitting: Plastic Surgery

## 2021-11-01 ENCOUNTER — Other Ambulatory Visit: Payer: Self-pay

## 2021-11-06 LAB — LIPID PANEL
Cholesterol: 178 (ref 0–200)
HDL: 53 (ref 35–70)
LDL Cholesterol: 105
Triglycerides: 111 (ref 40–160)

## 2021-11-06 LAB — HEPATIC FUNCTION PANEL
ALT: 30 (ref 7–35)
AST: 20 (ref 13–35)
Alkaline Phosphatase: 120 (ref 25–125)
Bilirubin, Total: 0.4

## 2021-11-06 LAB — BASIC METABOLIC PANEL
BUN: 21 (ref 4–21)
CO2: 25 — AB (ref 13–22)
Chloride: 104 (ref 99–108)
Creatinine: 1 (ref 0.5–1.1)
Glucose: 91
Potassium: 4.2 (ref 3.4–5.3)
Sodium: 142 (ref 137–147)

## 2021-11-06 LAB — COMPREHENSIVE METABOLIC PANEL
Albumin: 4.5 (ref 3.5–5.0)
Calcium: 9.5 (ref 8.7–10.7)
Globulin: 2.2

## 2021-11-08 NOTE — Anesthesia Preprocedure Evaluation (Addendum)
Anesthesia Evaluation  Patient identified by MRN, date of birth, ID band Patient awake    Reviewed: Allergy & Precautions, NPO status , Patient's Chart, lab work & pertinent test results  Airway Mallampati: II  TM Distance: >3 FB Neck ROM: Full    Dental no notable dental hx. (+) Teeth Intact, Dental Advisory Given   Pulmonary neg pulmonary ROS,    Pulmonary exam normal breath sounds clear to auscultation       Cardiovascular Exercise Tolerance: Good negative cardio ROS Normal cardiovascular exam Rhythm:Regular Rate:Normal     Neuro/Psych negative neurological ROS  negative psych ROS   GI/Hepatic Neg liver ROS, GERD  ,  Endo/Other  Hx of thyroid CA  Renal/GU negative Renal ROS     Musculoskeletal negative musculoskeletal ROS (+)   Abdominal (+) + obese (BMi 33.92),   Peds  Hematology   Anesthesia Other Findings   Reproductive/Obstetrics                            Anesthesia Physical Anesthesia Plan  ASA: 3  Anesthesia Plan: General   Post-op Pain Management: Dilaudid IV and Tylenol PO (pre-op)   Induction:   PONV Risk Score and Plan: 4 or greater and Treatment may vary due to age or medical condition, Midazolam, Ondansetron and Dexamethasone  Airway Management Planned: Oral ETT  Additional Equipment: None  Intra-op Plan:   Post-operative Plan: Extubation in OR  Informed Consent: I have reviewed the patients History and Physical, chart, labs and discussed the procedure including the risks, benefits and alternatives for the proposed anesthesia with the patient or authorized representative who has indicated his/her understanding and acceptance.     Dental advisory given  Plan Discussed with: Anesthesiologist and CRNA  Anesthesia Plan Comments:        Anesthesia Quick Evaluation

## 2021-11-09 ENCOUNTER — Ambulatory Visit (HOSPITAL_BASED_OUTPATIENT_CLINIC_OR_DEPARTMENT_OTHER): Payer: BC Managed Care – PPO | Admitting: Anesthesiology

## 2021-11-09 ENCOUNTER — Ambulatory Visit (HOSPITAL_BASED_OUTPATIENT_CLINIC_OR_DEPARTMENT_OTHER)
Admission: RE | Admit: 2021-11-09 | Discharge: 2021-11-09 | Disposition: A | Discharge status: Home / Self Care | Payer: BC Managed Care – PPO | Attending: Plastic Surgery | Admitting: Plastic Surgery

## 2021-11-09 ENCOUNTER — Encounter (HOSPITAL_BASED_OUTPATIENT_CLINIC_OR_DEPARTMENT_OTHER)
Admission: RE | Disposition: A | Discharge status: Home / Self Care | Payer: Self-pay | Source: Home / Self Care | Attending: Plastic Surgery

## 2021-11-09 ENCOUNTER — Encounter (HOSPITAL_BASED_OUTPATIENT_CLINIC_OR_DEPARTMENT_OTHER): Payer: Self-pay | Admitting: Plastic Surgery

## 2021-11-09 DIAGNOSIS — D170 Benign lipomatous neoplasm of skin and subcutaneous tissue of head, face and neck: Secondary | ICD-10-CM

## 2021-11-09 DIAGNOSIS — Z85850 Personal history of malignant neoplasm of thyroid: Secondary | ICD-10-CM | POA: Diagnosis not present

## 2021-11-09 DIAGNOSIS — K219 Gastro-esophageal reflux disease without esophagitis: Secondary | ICD-10-CM | POA: Diagnosis not present

## 2021-11-09 HISTORY — PX: MASS EXCISION: SHX2000

## 2021-11-09 SURGERY — EXCISION MASS
Anesthesia: General | Site: Shoulder | Laterality: Right

## 2021-11-09 MED ORDER — DEXAMETHASONE SODIUM PHOSPHATE 4 MG/ML IJ SOLN
INTRAMUSCULAR | Status: DC | PRN
  Administered 2021-11-09: 10 mg via INTRAVENOUS

## 2021-11-09 MED ORDER — PROPOFOL 10 MG/ML IV BOLUS
INTRAVENOUS | Status: DC | PRN
  Administered 2021-11-09: 50 mg via INTRAVENOUS
  Administered 2021-11-09: 150 mg via INTRAVENOUS

## 2021-11-09 MED ORDER — SODIUM CHLORIDE 0.9% FLUSH: 3.0000 mL | Freq: Two times a day (BID) | INTRAVENOUS | Status: DC

## 2021-11-09 MED ORDER — OXYCODONE HCL 5 MG PO TABS: 5.0000 mg | ORAL_TABLET | ORAL | Status: DC | PRN

## 2021-11-09 MED ORDER — SODIUM CHLORIDE 0.9% FLUSH: 3.0000 mL | INTRAVENOUS | Status: DC | PRN

## 2021-11-09 MED ORDER — LACTATED RINGERS IV SOLN: INTRAVENOUS | Status: DC

## 2021-11-09 MED ORDER — SODIUM CHLORIDE 0.9 % IV SOLN: 250.0000 mL | INTRAVENOUS | Status: DC | PRN

## 2021-11-09 MED ORDER — SUCCINYLCHOLINE CHLORIDE 200 MG/10ML IV SOSY
PREFILLED_SYRINGE | INTRAVENOUS | Status: AC
  Filled 2021-11-09: qty 10

## 2021-11-09 MED ORDER — MIDAZOLAM HCL 5 MG/5ML IJ SOLN
INTRAMUSCULAR | Status: DC | PRN
  Administered 2021-11-09: 2 mg via INTRAVENOUS

## 2021-11-09 MED ORDER — CEFAZOLIN SODIUM-DEXTROSE 2-4 GM/100ML-% IV SOLN
INTRAVENOUS | Status: AC
  Filled 2021-11-09: qty 100

## 2021-11-09 MED ORDER — ATROPINE SULFATE 0.4 MG/ML IV SOLN
INTRAVENOUS | Status: AC
  Filled 2021-11-09: qty 1

## 2021-11-09 MED ORDER — EPHEDRINE 5 MG/ML INJ
INTRAVENOUS | Status: AC
  Filled 2021-11-09: qty 5

## 2021-11-09 MED ORDER — MIDAZOLAM HCL 2 MG/2ML IJ SOLN
INTRAMUSCULAR | Status: AC
  Filled 2021-11-09: qty 2

## 2021-11-09 MED ORDER — LIDOCAINE HCL (CARDIAC) PF 100 MG/5ML IV SOSY
PREFILLED_SYRINGE | INTRAVENOUS | Status: DC | PRN
  Administered 2021-11-09: 100 mg via INTRAVENOUS

## 2021-11-09 MED ORDER — ROCURONIUM BROMIDE 10 MG/ML (PF) SYRINGE
PREFILLED_SYRINGE | INTRAVENOUS | Status: AC
  Filled 2021-11-09: qty 10

## 2021-11-09 MED ORDER — ACETAMINOPHEN 325 MG PO TABS: 650.0000 mg | ORAL_TABLET | ORAL | Status: DC | PRN

## 2021-11-09 MED ORDER — CEFAZOLIN SODIUM-DEXTROSE 2-4 GM/100ML-% IV SOLN
2.0000 g | INTRAVENOUS | Status: AC
  Administered 2021-11-09: 2 g via INTRAVENOUS

## 2021-11-09 MED ORDER — ONDANSETRON HCL 4 MG/2ML IJ SOLN
INTRAMUSCULAR | Status: DC | PRN
  Administered 2021-11-09: 4 mg via INTRAVENOUS

## 2021-11-09 MED ORDER — 0.9 % SODIUM CHLORIDE (POUR BTL) OPTIME
TOPICAL | Status: DC | PRN
  Administered 2021-11-09: 250 mL

## 2021-11-09 MED ORDER — FENTANYL CITRATE (PF) 100 MCG/2ML IJ SOLN
INTRAMUSCULAR | Status: DC | PRN
  Administered 2021-11-09: 100 ug via INTRAVENOUS

## 2021-11-09 MED ORDER — PHENYLEPHRINE 40 MCG/ML (10ML) SYRINGE FOR IV PUSH (FOR BLOOD PRESSURE SUPPORT)
PREFILLED_SYRINGE | INTRAVENOUS | Status: AC
  Filled 2021-11-09: qty 10

## 2021-11-09 MED ORDER — LIDOCAINE 2% (20 MG/ML) 5 ML SYRINGE
INTRAMUSCULAR | Status: AC
  Filled 2021-11-09: qty 5

## 2021-11-09 MED ORDER — DEXAMETHASONE SODIUM PHOSPHATE 10 MG/ML IJ SOLN
INTRAMUSCULAR | Status: AC
  Filled 2021-11-09: qty 1

## 2021-11-09 MED ORDER — FENTANYL CITRATE (PF) 100 MCG/2ML IJ SOLN
INTRAMUSCULAR | Status: AC
  Filled 2021-11-09: qty 2

## 2021-11-09 MED ORDER — FENTANYL CITRATE (PF) 100 MCG/2ML IJ SOLN: 25.0000 ug | INTRAMUSCULAR | Status: DC | PRN

## 2021-11-09 MED ORDER — CHLORHEXIDINE GLUCONATE CLOTH 2 % EX PADS: 6.0000 | MEDICATED_PAD | Freq: Once | CUTANEOUS | Status: DC

## 2021-11-09 MED ORDER — LIDOCAINE-EPINEPHRINE 1 %-1:100000 IJ SOLN
INTRAMUSCULAR | Status: DC | PRN
  Administered 2021-11-09: 20 mL

## 2021-11-09 MED ORDER — ONDANSETRON HCL 4 MG/2ML IJ SOLN
INTRAMUSCULAR | Status: AC
  Filled 2021-11-09: qty 2

## 2021-11-09 MED ORDER — ACETAMINOPHEN 325 MG RE SUPP: 650.0000 mg | RECTAL | Status: DC | PRN

## 2021-11-09 SURGICAL SUPPLY — 76 items
ADH SKN CLS APL DERMABOND .7 (GAUZE/BANDAGES/DRESSINGS) ×1
APL SKNCLS STERI-STRIP NONHPOA (GAUZE/BANDAGES/DRESSINGS)
BAND INSRT 18 STRL LF DISP RB (MISCELLANEOUS)
BAND RUBBER #18 3X1/16 STRL (MISCELLANEOUS) IMPLANT
BENZOIN TINCTURE PRP APPL 2/3 (GAUZE/BANDAGES/DRESSINGS) IMPLANT
BLADE CLIPPER SURG (BLADE) IMPLANT
BLADE SURG 15 STRL LF DISP TIS (BLADE) ×1 IMPLANT
BLADE SURG 15 STRL SS (BLADE) ×2
BNDG CONFORM 2 STRL LF (GAUZE/BANDAGES/DRESSINGS) IMPLANT
BNDG ELASTIC 2X5.8 VLCR STR LF (GAUZE/BANDAGES/DRESSINGS) IMPLANT
CANISTER SUCT 1200ML W/VALVE (MISCELLANEOUS) IMPLANT
CLEANER CAUTERY TIP 5X5 PAD (MISCELLANEOUS) IMPLANT
CORD BIPOLAR FORCEPS 12FT (ELECTRODE) IMPLANT
COVER BACK TABLE 60X90IN (DRAPES) ×2 IMPLANT
COVER MAYO STAND STRL (DRAPES) ×2 IMPLANT
DECANTER SPIKE VIAL GLASS SM (MISCELLANEOUS) IMPLANT
DERMABOND ADVANCED (GAUZE/BANDAGES/DRESSINGS) ×1
DERMABOND ADVANCED .7 DNX12 (GAUZE/BANDAGES/DRESSINGS) IMPLANT
DRAPE LAPAROTOMY 100X72 PEDS (DRAPES) IMPLANT
DRAPE U-SHAPE 76X120 STRL (DRAPES) ×2 IMPLANT
DRESSING MEPILEX FLEX 4X4 (GAUZE/BANDAGES/DRESSINGS) IMPLANT
DRSG MEPILEX FLEX 4X4 (GAUZE/BANDAGES/DRESSINGS) ×2
DRSG TEGADERM 2-3/8X2-3/4 SM (GAUZE/BANDAGES/DRESSINGS) IMPLANT
DRSG TEGADERM 4X4.75 (GAUZE/BANDAGES/DRESSINGS) IMPLANT
ELECT BLADE 4.0 EZ CLEAN MEGAD (MISCELLANEOUS) ×2
ELECT COATED BLADE 2.86 ST (ELECTRODE) ×1 IMPLANT
ELECT NDL BLADE 2-5/6 (NEEDLE) IMPLANT
ELECT NEEDLE BLADE 2-5/6 (NEEDLE) IMPLANT
ELECT REM PT RETURN 9FT ADLT (ELECTROSURGICAL) ×2
ELECT REM PT RETURN 9FT PED (ELECTROSURGICAL)
ELECTRODE BLDE 4.0 EZ CLN MEGD (MISCELLANEOUS) IMPLANT
ELECTRODE REM PT RETRN 9FT PED (ELECTROSURGICAL) IMPLANT
ELECTRODE REM PT RTRN 9FT ADLT (ELECTROSURGICAL) IMPLANT
GAUZE 4X4 16PLY ~~LOC~~+RFID DBL (SPONGE) ×1 IMPLANT
GAUZE SPONGE 4X4 12PLY STRL LF (GAUZE/BANDAGES/DRESSINGS) IMPLANT
GLOVE SURG ENC MOIS LTX SZ6.5 (GLOVE) ×3 IMPLANT
GOWN STRL REUS W/ TWL LRG LVL3 (GOWN DISPOSABLE) ×2 IMPLANT
GOWN STRL REUS W/ TWL XL LVL3 (GOWN DISPOSABLE) IMPLANT
GOWN STRL REUS W/TWL LRG LVL3 (GOWN DISPOSABLE) ×4
GOWN STRL REUS W/TWL XL LVL3 (GOWN DISPOSABLE) ×4
HEMOSTAT ARISTA ABSORB 3G PWDR (HEMOSTASIS) ×1 IMPLANT
NDL HYPO 25X1 1.5 SAFETY (NEEDLE) IMPLANT
NDL HYPO 27GX1-1/4 (NEEDLE) ×1 IMPLANT
NDL HYPO 30GX1 BEV (NEEDLE) IMPLANT
NEEDLE HYPO 25X1 1.5 SAFETY (NEEDLE) ×2 IMPLANT
NEEDLE HYPO 27GX1-1/4 (NEEDLE) IMPLANT
NEEDLE HYPO 30GX1 BEV (NEEDLE) IMPLANT
NS IRRIG 1000ML POUR BTL (IV SOLUTION) ×1 IMPLANT
PACK BASIN DAY SURGERY FS (CUSTOM PROCEDURE TRAY) ×2 IMPLANT
PAD CLEANER CAUTERY TIP 5X5 (MISCELLANEOUS)
PENCIL SMOKE EVACUATOR (MISCELLANEOUS) ×1 IMPLANT
SHEET MEDIUM DRAPE 40X70 STRL (DRAPES) ×1 IMPLANT
SLEEVE SCD COMPRESS KNEE MED (STOCKING) ×1 IMPLANT
SPONGE GAUZE 2X2 8PLY STRL LF (GAUZE/BANDAGES/DRESSINGS) IMPLANT
STRIP CLOSURE SKIN 1/2X4 (GAUZE/BANDAGES/DRESSINGS) IMPLANT
SUCTION FRAZIER HANDLE 10FR (MISCELLANEOUS) ×2
SUCTION TUBE FRAZIER 10FR DISP (MISCELLANEOUS) IMPLANT
SUT MNCRL 6-0 UNDY P1 1X18 (SUTURE) IMPLANT
SUT MNCRL AB 3-0 PS2 18 (SUTURE) ×1 IMPLANT
SUT MNCRL AB 4-0 PS2 18 (SUTURE) ×1 IMPLANT
SUT MON AB 5-0 P3 18 (SUTURE) IMPLANT
SUT MON AB 5-0 PS2 18 (SUTURE) IMPLANT
SUT MONOCRYL 6-0 P1 1X18 (SUTURE)
SUT PROLENE 5 0 P 3 (SUTURE) IMPLANT
SUT PROLENE 5 0 PS 2 (SUTURE) IMPLANT
SUT PROLENE 6 0 P 1 18 (SUTURE) IMPLANT
SUT VIC AB 5-0 P-3 18X BRD (SUTURE) IMPLANT
SUT VIC AB 5-0 P3 18 (SUTURE)
SUT VIC AB 5-0 PS2 18 (SUTURE) IMPLANT
SUT VICRYL 4-0 PS2 18IN ABS (SUTURE) IMPLANT
SYR BULB EAR ULCER 3OZ GRN STR (SYRINGE) IMPLANT
SYR CONTROL 10ML LL (SYRINGE) ×2 IMPLANT
TOWEL GREEN STERILE FF (TOWEL DISPOSABLE) ×2 IMPLANT
TRAY DSU PREP LF (CUSTOM PROCEDURE TRAY) ×2 IMPLANT
TUBE CONNECTING 20X1/4 (TUBING) ×1 IMPLANT
YANKAUER SUCT BULB TIP NO VENT (SUCTIONS) ×1 IMPLANT

## 2021-11-09 NOTE — Op Note (Signed)
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 female born on 05/07/1966, is here for treatment of a large right clavicular lipoma.   PROCEDURE DETAILS:  The patient was seen prior to surgery and marked.  The IV antibiotics were given. The patient was taken to the operating room and given a general anesthetic. A standard time out was performed and all information was confirmed by those in the room. SCDs were placed.   The right neck and shoulder were prepped and draped.  Local with epinephrine was injected at the skin.  The skin was opened with a #15 blade for 2.5 cm.  The bovie was used to dissect to the muscle.  The anterior edge of the muscle was located and opened.  The lipoma was under the muscle.  The capsule was opened and the entire lipoma was removed 5 x 10 cm. Hemostasis was achieved with electrocautery.  The area was irrigated with saline.  Ron Agee was placed to be sure there was no oozing.  The deep layer was closed with the 3-0 Monocryl followed by 4-0 Monocryl.  Derma bond and steri strips applied. The patient was allowed to wake up and taken to recovery room in stable condition at the end of the case. The family was notified at the end of the case.   The advanced practice practitioner (APP) assisted throughout the case.  The APP was essential in retraction and counter traction when needed to make the case progress smoothly.  This retraction and assistance made it possible to see the tissue plans for the procedure.  The assistance was needed for blood control, tissue re-approximation and assisted with closure of the incision site.

## 2021-11-09 NOTE — Anesthesia Postprocedure Evaluation (Signed)
Anesthesia Post Note  Patient: Shaquisha Wynn  Procedure(s) Performed: EXCISION RIGHT CLAVICULAR MASS (Right: Shoulder)     Patient location during evaluation: PACU Anesthesia Type: General Level of consciousness: awake and alert Pain management: pain level controlled Vital Signs Assessment: post-procedure vital signs reviewed and stable Respiratory status: spontaneous breathing, nonlabored ventilation, respiratory function stable and patient connected to nasal cannula oxygen Cardiovascular status: blood pressure returned to baseline and stable Postop Assessment: no apparent nausea or vomiting Anesthetic complications: no   No notable events documented.  Last Vitals:  Vitals:   11/09/21 1115 11/09/21 1154  BP: 109/76 122/74  Pulse: 82 71  Resp: 18 16  Temp:  (!) 36.1 C  SpO2: 96% 98%    Last Pain:  Vitals:   11/09/21 1154  TempSrc:   PainSc: 0-No pain                 Barnet Glasgow

## 2021-11-09 NOTE — Anesthesia Procedure Notes (Signed)
Procedure Name: LMA Insertion Date/Time: 11/09/2021 10:07 AM Performed by: Willa Frater, CRNA Pre-anesthesia Checklist: Patient identified, Emergency Drugs available, Suction available and Patient being monitored Patient Re-evaluated:Patient Re-evaluated prior to induction Oxygen Delivery Method: Circle system utilized Preoxygenation: Pre-oxygenation with 100% oxygen Induction Type: IV induction Ventilation: Mask ventilation without difficulty LMA: LMA inserted LMA Size: 4.0 Number of attempts: 1 Airway Equipment and Method: Bite block Placement Confirmation: positive ETCO2 Tube secured with: Tape Dental Injury: Teeth and Oropharynx as per pre-operative assessment

## 2021-11-09 NOTE — Transfer of Care (Signed)
Immediate Anesthesia Transfer of Care Note  Patient: Claudia Acevedo  Procedure(s) Performed: EXCISION RIGHT CLAVICULAR MASS (Right: Shoulder)  Patient Location: PACU  Anesthesia Type:General  Level of Consciousness: sedated  Airway & Oxygen Therapy: Patient Spontanous Breathing and Patient connected to face mask oxygen  Post-op Assessment: Report given to RN and Post -op Vital signs reviewed and stable  Post vital signs: Reviewed and stable  Last Vitals:  Vitals Value Taken Time  BP    Temp    Pulse    Resp    SpO2      Last Pain:  Vitals:   11/09/21 0940  TempSrc: Oral  PainSc: 0-No pain      Patients Stated Pain Goal: 3 (94/58/59 2924)  Complications: No notable events documented.

## 2021-11-09 NOTE — Interval H&P Note (Signed)
History and Physical Interval Note:  11/09/2021 9:31 AM  Claudia Acevedo  has presented today for surgery, with the diagnosis of MASS OF NECK.  The various methods of treatment have been discussed with the patient and family. After consideration of risks, benefits and other options for treatment, the patient has consented to  Procedure(s): EXCISION RIGHT CLAVICULAR MASS (Right) as a surgical intervention.  The patient's history has been reviewed, patient examined, no change in status, stable for surgery.  I have reviewed the patient's chart and labs.  Questions were answered to the patient's satisfaction.     Loel Lofty Mattie Novosel

## 2021-11-09 NOTE — Discharge Instructions (Addendum)
INSTRUCTIONS FOR AFTER SURGERY   You will likely have some questions about what to expect following your operation.  The following information will help you and your family understand what to expect when you are discharged from the hospital.  Following these guidelines will help ensure a smooth recovery and reduce risks of complications.  Postoperative instructions include information on: diet, wound care, medications and physical activity.  AFTER SURGERY Expect to go home after the procedure.  In some cases, you may need to spend one night in the hospital for observation.  DIET Surgery does not require a specific diet.  However, the healthier you eat the better your body can start healing. It is important to increasing your protein intake.  This means limiting the foods with sugar and carbohydrates.  Focus on vegetables and some meat.  If you have any liposuction during your procedure be sure to drink water.  If your urine is bright yellow, then it is concentrated, and you need to drink more water.  As a general rule after surgery, you should have 8 ounces of water every hour while awake.  If you find you are persistently nauseated or unable to take in liquids let us know.  NO TOBACCO USE or EXPOSURE.  This will slow your healing process and increase the risk of a wound.  WOUND CARE After 3 days you can shower. Once dry apply ACE wrap, binder or sports bra.  Use a mild soap like Dial, Dove and [place]. You may have Topifoam or Lipofoam on.  It is soft and spongy and helps keep you from getting creases if you have liposuction.  This can be removed before the shower and then replaced.  If you need more it is available on Amazon (Lipofoam). If you have steri-strips / tape directly attached to your skin leave them in place. It is OK to get these wet.   No baths, pools or hot tubs for four weeks. We close your incision to leave the smallest and best-looking scar. No ointment or creams on your incisions  until given the go ahead.  Especially not Neosporin (Too many skin reactions with this one).  A few weeks after surgery you can use Mederma and start massaging the scar. We ask you to wear your binder or sports bra for the first 6 weeks around the clock, including while sleeping. This provides added comfort and helps reduce the fluid accumulation at the surgery site.  ACTIVITY No heavy lifting until cleared by the doctor.  This usually means no more than a half-gallon of milk.  It is OK to walk and climb stairs. In fact, moving your legs is very important to decrease your risk of a blood clot.  It will also help keep you from getting deconditioned.  Every 1 to 2 hours get up and walk for 5 minutes. This will help with a quicker recovery back to normal.  Let pain be your guide so you don't do too much.  This is not the time for spring cleaning and don't plan on taking care of anyone else.  This time is for you to recover,  You will be more comfortable if you sleep and rest with your head elevated either with a few pillows under you or in a recliner.  No stomach sleeping for a three months.  WORK Everyone returns to work at different times. As a rough guide, most people take at least 1 - 2 weeks off prior to returning to work. If  you need documentation for your job, bring the forms to your postoperative follow up visit.  DRIVING Arrange for someone to bring you home from the hospital.  You may be able to drive a few days after surgery but not while taking any narcotics or valium.  BOWEL MOVEMENTS Constipation can occur after anesthesia and while taking pain medication.  It is important to stay ahead for your comfort.  We recommend taking Milk of Magnesia (2 tablespoons; twice a day) while taking the pain pills.  MEDICATIONS You may be prescribed should start after surgery At your preoperative visit for you history and physical you may have been given the following medications: An antibiotic: Start  this medication when you get home and take according to the instructions on the bottle. Zofran 4 mg:  This is to treat nausea and vomiting.  You can take this every 6 hours as needed and only if needed. Valium 2 mg: This is for muscle tightness if you have an implant or expander. This will help relax your muscle which also helps with pain control.  This can be taken every 12 hours as needed. Don't drive after taking this medication. Norco (hydrocodone/acetaminophen) 5/325 mg:  This is only to be used after you have taken the motrin or the tylenol. Every 8 hours as needed.   Over the counter Medication to take: Ibuprofen (Motrin) 600 mg:  Take this every 6 hours.  If you have additional pain then take 500 mg of the tylenol every 8 hours.  Only take the Norco after you have tried these two. Miralax or stool softener of choice: Take this according to the bottle if you take the [place] Call your surgeon's office if any of the following occur: Fever 101 degrees F or greater Excessive bleeding or fluid from the incision site. Pain that increases over time without aid from the medications Redness, warmth, or pus draining from incision sites Persistent nausea or inability to take in liquids Severe misshapen area that underwent the operation.   Post Anesthesia Home Care Instructions  Activity: Get plenty of rest for the remainder of the day. A responsible individual must stay with you for 24 hours following the procedure.  For the next 24 hours, DO NOT: -Drive a car -Paediatric nurse -Drink alcoholic beverages -Take any medication unless instructed by your physician -Make any legal decisions or sign important papers.  Meals: Start with liquid foods such as gelatin or soup. Progress to regular foods as tolerated. Avoid greasy, spicy, heavy foods. If nausea and/or vomiting occur, drink only clear liquids until the nausea and/or vomiting subsides. Call your physician if vomiting  continues.  Special Instructions/Symptoms: Your throat may feel dry or sore from the anesthesia or the breathing tube placed in your throat during surgery. If this causes discomfort, gargle with warm salt water. The discomfort should disappear within 24 hours.  If you had a scopolamine patch placed behind your ear for the management of post- operative nausea and/or vomiting:  1. The medication in the patch is effective for 72 hours, after which it should be removed.  Wrap patch in a tissue and discard in the trash. Wash hands thoroughly with soap and water. 2. You may remove the patch earlier than 72 hours if you experience unpleasant side effects which may include dry mouth, dizziness or visual disturbances. 3. Avoid touching the patch. Wash your hands with soap and water after contact with the patch.

## 2021-11-10 ENCOUNTER — Encounter (HOSPITAL_BASED_OUTPATIENT_CLINIC_OR_DEPARTMENT_OTHER): Payer: Self-pay | Admitting: Plastic Surgery

## 2021-11-10 LAB — SURGICAL PATHOLOGY

## 2021-11-10 NOTE — Addendum Note (Signed)
Addendum  created 11/10/21 0859 by Maryella Shivers, CRNA   Charge Capture section accepted

## 2021-11-21 ENCOUNTER — Encounter: Payer: Self-pay | Admitting: Plastic Surgery

## 2021-11-21 ENCOUNTER — Other Ambulatory Visit: Payer: Self-pay

## 2021-11-21 ENCOUNTER — Ambulatory Visit (INDEPENDENT_AMBULATORY_CARE_PROVIDER_SITE_OTHER): Payer: BC Managed Care – PPO | Admitting: Plastic Surgery

## 2021-11-21 DIAGNOSIS — R221 Localized swelling, mass and lump, neck: Secondary | ICD-10-CM

## 2021-11-21 NOTE — Progress Notes (Signed)
The patient is a 56 year old female here for follow-up after resection of a lipoma of her right clavicle area.  The path was consistent for lipoma.  She has a little bit of swelling but is healing very nicely.  I was able to remove the suture and placed another Steri-Strip on it.  I will plan to see her back in 2 to 3 weeks.

## 2021-12-11 ENCOUNTER — Ambulatory Visit: Payer: BC Managed Care – PPO | Admitting: "Endocrinology

## 2021-12-19 ENCOUNTER — Other Ambulatory Visit: Payer: Self-pay

## 2021-12-19 ENCOUNTER — Encounter: Payer: Self-pay | Admitting: Plastic Surgery

## 2021-12-19 ENCOUNTER — Ambulatory Visit: Payer: BC Managed Care – PPO | Admitting: Plastic Surgery

## 2021-12-19 DIAGNOSIS — R221 Localized swelling, mass and lump, neck: Secondary | ICD-10-CM

## 2021-12-19 NOTE — Progress Notes (Signed)
° °  Subjective:    Patient ID: Claudia Acevedo, female    DOB: 1966-02-18, 56 y.o.   MRN: [id number]  The patient is a 56 year old female here for follow-up on her lipoma excision.  The area is doing very well.  There does not appear to be any seroma or hematoma.  The scar is healing extremely well.  She is very pleased with the results.     Review of Systems  Constitutional: Negative.   Eyes: Negative.   Respiratory: Negative.    Cardiovascular: Negative.   Gastrointestinal: Negative.   Endocrine: Negative.   Genitourinary: Negative.   Musculoskeletal: Negative.       Objective:   Physical Exam Constitutional:      Appearance: Normal appearance.  HENT:     Head: Normocephalic and atraumatic.  Pulmonary:     Effort: Pulmonary effort is normal.  Skin:    General: Skin is warm.     Capillary Refill: Capillary refill takes less than 2 seconds.     Coloration: Skin is not jaundiced.     Findings: No bruising or lesion.  Neurological:     Mental Status: She is alert and oriented to person, place, and time.  Psychiatric:        Mood and Affect: Mood normal.        Behavior: Behavior normal.        Thought Content: Thought content normal.       Assessment & Plan:     ICD-10-CM   1. Mass of neck  R22.1        Follow-up as needed.  Willeen Niece is recommended for the scar.  Pictures were obtained of the patient and placed in the chart with the patient's or guardian's permission.

## 2021-12-20 LAB — COMPREHENSIVE METABOLIC PANEL
ALT: 36 IU/L — ABNORMAL HIGH (ref 0–32)
AST: 31 IU/L (ref 0–40)
Albumin/Globulin Ratio: 1.8 (ref 1.2–2.2)
Albumin: 4.3 g/dL (ref 3.8–4.9)
Alkaline Phosphatase: 114 IU/L (ref 44–121)
BUN/Creatinine Ratio: 16 (ref 9–23)
BUN: 18 mg/dL (ref 6–24)
Bilirubin Total: 0.4 mg/dL (ref 0.0–1.2)
CO2: 26 mmol/L (ref 20–29)
Calcium: 9.4 mg/dL (ref 8.7–10.2)
Chloride: 103 mmol/L (ref 96–106)
Creatinine, Ser: 1.12 mg/dL — ABNORMAL HIGH (ref 0.57–1.00)
Globulin, Total: 2.4 g/dL (ref 1.5–4.5)
Glucose: 90 mg/dL (ref 70–99)
Potassium: 4.1 mmol/L (ref 3.5–5.2)
Sodium: 143 mmol/L (ref 134–144)
Total Protein: 6.7 g/dL (ref 6.0–8.5)
eGFR: 58 mL/min/{1.73_m2} — ABNORMAL LOW (ref >59)

## 2021-12-20 LAB — TSH: TSH: 10.8 u[IU]/mL — ABNORMAL HIGH (ref 0.450–4.500)

## 2021-12-20 LAB — THYROGLOBULIN ANTIBODY: Thyroglobulin Antibody: <1 IU/mL (ref 0.0–0.9)

## 2021-12-20 LAB — THYROID PEROXIDASE ANTIBODY: Thyroperoxidase Ab SerPl-aCnc: 13 IU/mL (ref 0–34)

## 2021-12-20 LAB — T4, FREE: Free T4: 1.45 ng/dL (ref 0.82–1.77)

## 2021-12-25 ENCOUNTER — Other Ambulatory Visit: Payer: Self-pay

## 2021-12-25 ENCOUNTER — Encounter: Payer: Self-pay | Admitting: "Endocrinology

## 2021-12-25 ENCOUNTER — Ambulatory Visit: Payer: BC Managed Care – PPO | Admitting: "Endocrinology

## 2021-12-25 VITALS — BP 136/84 | HR 75 | Ht 68.0 in | Wt 228.0 lb

## 2021-12-25 DIAGNOSIS — C73 Malignant neoplasm of thyroid gland: Secondary | ICD-10-CM

## 2021-12-25 DIAGNOSIS — Z6834 Body mass index (BMI) 34.0-34.9, adult: Secondary | ICD-10-CM

## 2021-12-25 DIAGNOSIS — E6609 Other obesity due to excess calories: Secondary | ICD-10-CM | POA: Insufficient documentation

## 2021-12-25 DIAGNOSIS — E890 Postprocedural hypothyroidism: Secondary | ICD-10-CM | POA: Diagnosis not present

## 2021-12-25 MED ORDER — LEVOTHYROXINE SODIUM 112 MCG PO TABS: 112.0000 ug | ORAL_TABLET | Freq: Every day | ORAL | 1 refills | Status: DC

## 2021-12-25 NOTE — Patient Instructions (Signed)

## 2021-12-25 NOTE — Progress Notes (Signed)
12/25/2021, 6:11 PM  Endocrinology follow-up note   Subjective:    Patient ID: Claudia Acevedo, female    DOB: 07-07-1966, PCP Asencion Noble, MD   Past Medical History:  Diagnosis Date   GERD (gastroesophageal reflux disease)    Hyperlipidemia 10/2020   Thyroid cancer [facility])    Thyroid disease    Past Surgical History:  Procedure Laterality Date   BREAST SURGERY  03/1986   lft fib removed from left breast   EYE SURGERY Bilateral 2000-2001   laser eye surgery for nearsightedness   LIPOMA EXCISION Left 10/19/2013   Procedure: EXCISION THIGH LIPOMA;  Surgeon: Merrie Roof, MD;  Location: [facility];  Service: General;  Laterality: Left;   MASS EXCISION Right 11/09/2021   Procedure: EXCISION RIGHT CLAVICULAR MASS;  Surgeon: Wallace Going, DO;  Location: [facility];  Service: Plastics;  Laterality: Right;   THYROIDECTOMY Left 03/29/2021   Procedure: TOTAL THYROIDECTOMY WITH LEFT NECK DISSECTION;  Surgeon: Leta Baptist, MD;  Location: [facility];  Service: ENT;  Laterality: Left;   WISDOM TOOTH EXTRACTION  2004   Social History   Socioeconomic History   Marital status: Married    Spouse name: Not on file   Number of children: Not on file   Years of education: Not on file   Highest education level: Not on file  Occupational History   Not on file  Tobacco Use   Smoking status: Never   Smokeless tobacco: Never  Vaping Use   Vaping Use: Never used  Substance and Sexual Activity   Alcohol use: Yes    Comment: twice month   Drug use: No   Sexual activity: Yes    Birth control/protection: I.U.D.  Other Topics Concern   Not on file  Social History Narrative   Not on file   Social Determinants of Health   Financial Resource Strain: Not on file  Food Insecurity: Not on file  Transportation Needs: Not on file  Physical Activity: Not on file  Stress: Not on file  Social Connections: Not on file    Family History  Problem Relation Age of Onset   Cancer Mother    Thyroid disease Mother    Hyperlipidemia Mother    Hypertension Mother    ALS Father    Outpatient Encounter Medications as of 12/25/2021  Medication Sig   atorvastatin (LIPITOR) 20 MG tablet Take 20 mg by mouth at bedtime.   levothyroxine (SYNTHROID) 112 MCG tablet Take 1 tablet (112 mcg total) by mouth daily before breakfast for 180 doses.   [DISCONTINUED] levothyroxine (SYNTHROID) 100 MCG tablet Take 1 tablet (100 mcg total) by mouth daily before breakfast.   No facility-administered encounter medications on file as of 12/25/2021.   ALLERGIES: No Known Allergies  VACCINATION STATUS:  There is no immunization history on file for this patient.  HPI Claudia Acevedo is 56 y.o. female who presents today with a medical history as above. she is being seen in follow-up after she was seen in consultation for multifocal papillary thyroid cancer requested by Asencion Noble, MD.  She is accompanied by her husband to clinic.  History is obtained directly from the patient as well as  chart review.  See notes from last visit.  She is recovering from her recent thyroidectomy. -Fine-needle aspiration biopsy showed papillary thyroid carcinoma category 5.  She underwent total thyroidectomy and deep neck dissection on left side On March 29, 2021 by Dr. Benjamine Mola. Surgical sample showed left-sided thyroid malignancy measuring 1.4 cm and 0.2 cm, positive margins, 2 out of 11 lymph nodes positive. Pathology classification  mpT1B,PN1b. She was put on levothyroxine 100 mcg p.o. daily following her surgery.   She is compliant to her medication.  Her previsit labs are consistent with slight under replacement.   Her major complaint is the fact that she cannot lose weight.    -She recently underwent Thyrogen stimulated remnant ablation with only expected uptake in the neck, absent distant metastasis.  She denies any family history of thyroid malignancy.       She has well-controlled hyperlipidemia.  She had some thyroid dysfunction in her mother. She did not have significant goiter prior to her surgery or diagnosed with thyroid cancer.  She has previous lipoma surgery in 2014. She denies palpitations, tremors, heat intolerance.  She does have some mild degree of hot flashes.  She has recovered from thyroid surgery very well.  She also underwent lipoma surgery in the interval.   Review of Systems   Objective:    Vitals with BMI 12/25/2021 11/09/2021 11/09/2021  Height $Remov'5\' 8"'hMOmVw$  - -  Weight 228 lbs - -  BMI 36.46 - -  Systolic 803 212 248  Diastolic 84 74 76  Pulse 75 71 82    BP 136/84    Pulse 75    Ht $R'5\' 8"'eX$  (1.727 m)    Wt 228 lb (103.4 kg)    BMI 34.67 kg/m   Wt Readings from Last 3 Encounters:  12/25/21 228 lb (103.4 kg)  11/09/21 223 lb 1.7 oz (101.2 kg)  06/05/21 211 lb 12.8 oz (96.1 kg)    Physical Exam  Constitutional:  Body mass index is 34.67 kg/m.,  not in acute distress, normal state of mind Eyes: PERRLA, EOMI, no exophthalmos ENT: moist mucous membranes, + still healing long surgical scar on left and lower anterior neck, no gross cervical lymphadenopathy -Status post lipoma resection from left supraclavicular area.   CMP ( most recent) CMP     Component Value Date/Time   NA 143 12/18/2021 0749   K 4.1 12/18/2021 0749   CL 103 12/18/2021 0749   CO2 26 12/18/2021 0749   GLUCOSE 90 12/18/2021 0749   GLUCOSE 102 (H) 02/28/2021 1147   BUN 18 12/18/2021 0749   CREATININE 1.12 (H) 12/18/2021 0749   CALCIUM 9.4 12/18/2021 0749   PROT 6.7 12/18/2021 0749   ALBUMIN 4.3 12/18/2021 0749   AST 31 12/18/2021 0749   ALT 36 (H) 12/18/2021 0749   ALKPHOS 114 12/18/2021 0749   BILITOT 0.4 12/18/2021 0749   GFRNONAA >60 02/28/2021 1147   GFRAA >60 11/06/2018 0200   Biopsy and surgical pathology findings are reviewed.  FINDINGS: Two small foci of mild to moderate uptake along the midline of the lower neck in the  expected location of the thyroid bed are identified corresponding to expected residual functioning thyroid tissue. No signs of tracer avid nodal metastasis or distant metastatic disease.   IMPRESSION: Expected residual functioning thyroid tissue within the thyroid bed status post thyroidectomy. No signs nodal metastasis or distant metastatic disease.  Recent Results (from the past 2160 hour(s))  Basic metabolic panel     Status: Abnormal  Collection Time: 11/06/21 12:00 AM  Result Value Ref Range   Glucose 91    BUN 21 4 - 21   CO2 25 (A) 13 - 22   Creatinine 1.0 0.5 - 1.1   Potassium 4.2 3.4 - 5.3   Sodium 142 137 - 147   Chloride 104 99 - 108  Comprehensive metabolic panel     Status: None   Collection Time: 11/06/21 12:00 AM  Result Value Ref Range   Globulin 2.2    Calcium 9.5 8.7 - 10.7   Albumin 4.5 3.5 - 5.0  Lipid panel     Status: None   Collection Time: 11/06/21 12:00 AM  Result Value Ref Range   Triglycerides 111 40 - 160   Cholesterol 178 0 - 200   HDL 53 35 - 70   LDL Cholesterol 105   Hepatic function panel     Status: None   Collection Time: 11/06/21 12:00 AM  Result Value Ref Range   Alkaline Phosphatase 120 25 - 125   ALT 30 7 - 35   AST 20 13 - 35   Bilirubin, Total 0.4   Surgical pathology     Status: None   Collection Time: 11/09/21 10:24 AM  Result Value Ref Range   SURGICAL PATHOLOGY      SURGICAL PATHOLOGY CASE: [id number] PATIENT: Claudia Acevedo Surgical Pathology Report     Clinical History: mass of neck (cm)     FINAL MICROSCOPIC DIAGNOSIS:  A. SOFT TISSUE MASS, RIGHT CLAVICULAR, EXCISION: - Mature adipose tissue, consistent with lipoma      GROSS DESCRIPTION:  Received in formalin are portions of rubbery yellow adipose tissue measuring 6.5 x 6.5 x 2.5 cm in aggregate.  The cut surfaces are solid and homogeneous.  Sections are submitted in 2 cassettes.  [facility] 11/09/2021)   Final Diagnosis performed by Jaquita Folds, MD.   Electronically signed 11/10/2021 Technical component performed at [facility]. [facility], [place] [address] [place], [place] [address].  Professional component performed at [facility], [place] [address], [place], [place] [address].  Immunohistochemistry Technical component (if applicable) was performed at [facility]. [address] [place], [place],  [address].    IMMUNOHISTOCHEMISTRY DISCLAIMER (if applicable): Some of these immunohistochemical stains may have been developed and the performance characteristics determine by [facility]. Some may not have been cleared or approved by the U.S. Food and Drug Administration. The FDA has determined that such clearance or approval is not necessary. This test is used for clinical purposes. It should not be regarded as investigational or for research. This laboratory is certified under the [place] (CLIA-88) as qualified to perform high complexity clinical laboratory testing.  The controls stained appropriately.   TSH     Status: Abnormal   Collection Time: 12/18/21  7:49 AM  Result Value Ref Range   TSH 10.800 (H) 0.450 - 4.500 uIU/mL  T4, free     Status: None   Collection Time: 12/18/21  7:49 AM  Result Value Ref Range   Free T4 1.45 0.82 - 1.77 ng/dL  Thyroglobulin antibody     Status: None   Collection Time: 12/18/21  7:49 AM  Result Value Ref Range   Thyroglobulin Antibody <1.0 0.0 - 0.9 IU/mL    Comment: Thyroglobulin Antibody measured by [employer] Coulter Methodology  Thyroid peroxidase antibody     Status: None   Collection Time: 12/18/21  7:49 AM  Result Value Ref Range   Thyroperoxidase Ab SerPl-aCnc 13 0 - 34 IU/mL  Comprehensive metabolic panel     Status: Abnormal   Collection Time: 12/18/21  7:49 AM  Result Value Ref Range   Glucose 90 70 - 99 mg/dL   BUN 18 6 - 24 mg/dL   Creatinine, Ser 1.12 (H) 0.57  - 1.00 mg/dL   eGFR 58 (L) >59 mL/min/1.73   BUN/Creatinine Ratio 16 9 - 23   Sodium 143 134 - 144 mmol/L   Potassium 4.1 3.5 - 5.2 mmol/L   Chloride 103 96 - 106 mmol/L   CO2 26 20 - 29 mmol/L   Calcium 9.4 8.7 - 10.2 mg/dL   Total Protein 6.7 6.0 - 8.5 g/dL   Albumin 4.3 3.8 - 4.9 g/dL   Globulin, Total 2.4 1.5 - 4.5 g/dL   Albumin/Globulin Ratio 1.8 1.2 - 2.2   Bilirubin Total 0.4 0.0 - 1.2 mg/dL   Alkaline Phosphatase 114 44 - 121 IU/L   AST 31 0 - 40 IU/L   ALT 36 (H) 0 - 32 IU/L     Assessment & Plan:   1. Postsurgical hypothyroidism 2. Malignant neoplasm of thyroid gland (HCC)  She is status post total thyroidectomy for recently diagnosed follicular variant papillary thyroid cancer multifocal, locally invasive including metastases to lymph nodes 2 out of 11 in the left cervical region status post total thyroidectomy and left neck dissection.   Considering pathology classification mpT1B,PN1b, multifocal nature of malignancy, relative youth of the patient, putting her at intermediate risk of tumor recurrence, she was considered for I-131 thyroid remnant ablation with posttherapy scan showing expected uptake in the neck, no distant metastasis on May 22, 2021.   She will be considered for surveillance thyroid/neck ultrasound before her next visit in 6 months.  Regarding postsurgical hypothyroidism: Her most recent thyroid function tests are consistent with slight under replacement.  I discussed and increase her levothyroxine to 112 mcg p.o. daily before breakfast.     - We discussed about the correct intake of her thyroid hormone, on empty stomach at fasting, with water, separated by at least 30 minutes from breakfast and other medications,  and separated by more than 4 hours from calcium, iron, multivitamins, acid reflux medications (PPIs). -Patient is made aware of the fact that thyroid hormone replacement is needed for life, dose to be adjusted by periodic monitoring of thyroid  function tests.   Her recent labs show normal calcium of 9.4.  No need for calcium supplements.   Regarding her concern for weight management:  This patient will benefit from the most from lifestyle medicine.  - she acknowledges that there is a room for improvement in her food and drink choices. - Suggestion is made for her to avoid simple carbohydrates  from her diet including Cakes, Sweet Desserts, Ice Cream, Soda (diet and regular), Sweet Tea, Candies, Chips, Cookies, Store Bought Juices, Alcohol , Artificial Sweeteners,  Coffee Creamer, and "Sugar-free" Products, Lemonade. This will help patient to have more stable blood glucose profile and potentially avoid unintended weight gain.  The following Lifestyle Medicine recommendations according to [place]  [facility]) were discussed and and offered to patient and she  agrees to start the journey:  A. Whole Foods, Plant-Based Nutrition comprising of fruits and vegetables, plant-based proteins, whole-grain carbohydrates was discussed in detail with the patient.   A list for source of those nutrients were also provided to the patient.  Patient will  use only water or unsweetened tea for hydration. B.  The need to stay away from risky substances including alcohol, smoking; obtaining 7 to 9 hours of restorative sleep, at least 150 minutes of moderate intensity exercise weekly, the importance of healthy social connections,  and stress management techniques were discussed. C.  A full color page of  Calorie density of various food groups per pound showing examples of each food groups was provided to the patient.    - she is advised to maintain close follow up with Asencion Noble, MD for primary care needs.  I spent 35 minutes in the care of the patient today including review of labs from Thyroid Function, CMP, and other relevant labs ; imaging/biopsy records (current and previous including abstractions from other facilities);  face-to-face time discussing  her lab results and symptoms, medications doses, her options of short and long term treatment based on the latest standards of care / guidelines;   and documenting the encounter.  Claudia Acevedo  participated in the discussions, expressed understanding, and voiced agreement with the above plans.  All questions were answered to her satisfaction. she is encouraged to contact clinic should she have any questions or concerns prior to her return visit.    Follow up plan: Return in about 6 months (around 06/24/2022) for Thyroid / Neck Ultrasound, F/U with Pre-visit Labs.   Glade Lloyd, MD [facility] [address] [place], [place] [address] Phone: [phone]  Fax: [phone]     12/25/2021, 6:11 PM  This note was partially dictated with voice recognition software. Similar sounding words can be transcribed inadequately or may not  be corrected upon review.

## 2022-06-21 ENCOUNTER — Telehealth: Payer: Self-pay | Admitting: "Endocrinology

## 2022-06-21 MED ORDER — LEVOTHYROXINE SODIUM 112 MCG PO TABS: 112.0000 ug | ORAL_TABLET | Freq: Every day | ORAL | 0 refills | Status: DC

## 2022-06-21 NOTE — Telephone Encounter (Signed)
Rx sent 

## 2022-06-21 NOTE — Telephone Encounter (Signed)
Patient said nobody has still not called her to schedule her ultrasound. I gave her the # to [facility]. I canceled her appt for Monday because she needs it for appt and she will do labs on Monday. In the mean time, she needs a refill on her thyroid medication sent to   [facility] on [address] in [place]

## 2022-06-25 ENCOUNTER — Ambulatory Visit (HOSPITAL_COMMUNITY)
Admission: RE | Admit: 2022-06-25 | Discharge: 2022-06-25 | Disposition: A | Discharge status: Home / Self Care | Payer: BC Managed Care – PPO | Source: Ambulatory Visit | Attending: "Endocrinology | Admitting: "Endocrinology

## 2022-06-25 ENCOUNTER — Ambulatory Visit: Payer: BC Managed Care – PPO | Admitting: "Endocrinology

## 2022-06-25 DIAGNOSIS — C73 Malignant neoplasm of thyroid gland: Secondary | ICD-10-CM | POA: Insufficient documentation

## 2022-07-02 LAB — THYROGLOBULIN LEVEL: Thyroglobulin (TG-RIA): <2 ng/mL

## 2022-07-02 LAB — T4, FREE: Free T4: 1.36 ng/dL (ref 0.82–1.77)

## 2022-07-02 LAB — TSH: TSH: 3.9 u[IU]/mL (ref 0.450–4.500)

## 2022-07-09 ENCOUNTER — Encounter: Payer: Self-pay | Admitting: "Endocrinology

## 2022-07-09 ENCOUNTER — Ambulatory Visit: Payer: BC Managed Care – PPO | Admitting: "Endocrinology

## 2022-07-09 VITALS — BP 118/76 | HR 68 | Ht 68.0 in | Wt 234.4 lb

## 2022-07-09 DIAGNOSIS — C73 Malignant neoplasm of thyroid gland: Secondary | ICD-10-CM | POA: Diagnosis not present

## 2022-07-09 DIAGNOSIS — E890 Postprocedural hypothyroidism: Secondary | ICD-10-CM

## 2022-07-09 MED ORDER — LEVOTHYROXINE SODIUM 112 MCG PO TABS: 112.0000 ug | ORAL_TABLET | Freq: Every day | ORAL | 1 refills | Status: DC

## 2022-07-09 NOTE — Progress Notes (Signed)
07/09/2022, 12:28 PM  Endocrinology follow-up note   Subjective:    Patient ID: Claudia Acevedo, female    DOB: 20-Apr-1966, PCP Claudia Noble, MD   Past Medical History:  Diagnosis Date   GERD (gastroesophageal reflux disease)    Hyperlipidemia 10/2020   Thyroid cancer [facility])    Thyroid disease    Past Surgical History:  Procedure Laterality Date   BREAST SURGERY  03/1986   lft fib removed from left breast   EYE SURGERY Bilateral 2000-2001   laser eye surgery for nearsightedness   LIPOMA EXCISION Left 10/19/2013   Procedure: EXCISION THIGH LIPOMA;  Surgeon: Claudia Roof, MD;  Location: [place];  Service: General;  Laterality: Left;   MASS EXCISION Right 11/09/2021   Procedure: EXCISION RIGHT CLAVICULAR MASS;  Surgeon: Claudia Going, DO;  Location: [place];  Service: Plastics;  Laterality: Right;   THYROIDECTOMY Left 03/29/2021   Procedure: TOTAL THYROIDECTOMY WITH LEFT NECK DISSECTION;  Surgeon: Claudia Baptist, MD;  Location: [place];  Service: ENT;  Laterality: Left;   WISDOM TOOTH EXTRACTION  2004   Social History   Socioeconomic History   Marital status: Married    Spouse name: Not on file   Number of children: Not on file   Years of education: Not on file   Highest education level: Not on file  Occupational History   Not on file  Tobacco Use   Smoking status: Never   Smokeless tobacco: Never  Vaping Use   Vaping Use: Never used  Substance and Sexual Activity   Alcohol use: Yes    Comment: twice month   Drug use: No   Sexual activity: Yes    Birth control/protection: I.U.D.  Other Topics Concern   Not on file  Social History Narrative   Not on file   Social Determinants of Health   Financial Resource Strain: Not on file  Food Insecurity: Not on file  Transportation Needs: Not on file  Physical Activity: Not on file  Stress: Not on file  Social Connections: Not on file    Family History  Problem Relation Age of Onset   Cancer Mother    Thyroid disease Mother    Hyperlipidemia Mother    Hypertension Mother    ALS Father    Outpatient Encounter Medications as of 07/09/2022  Medication Sig   atorvastatin (LIPITOR) 20 MG tablet Take 20 mg by mouth at bedtime.   levothyroxine (SYNTHROID) 112 MCG tablet Take 1 tablet (112 mcg total) by mouth daily before breakfast for 180 doses.   [DISCONTINUED] levothyroxine (SYNTHROID) 112 MCG tablet Take 1 tablet (112 mcg total) by mouth daily before breakfast for 180 doses.   No facility-administered encounter medications on file as of 07/09/2022.   ALLERGIES: No Known Allergies  VACCINATION STATUS:  There is no immunization history on file for this patient.  HPI Claudia Acevedo is 56 y.o. female who presents today with a medical history as above. she is being seen in follow-up after she was seen in consultation for multifocal papillary thyroid cancer requested by Claudia Noble, MD.  She is accompanied by her husband to clinic.   See notes from last visit.  She is recovering from her recent thyroidectomy. -Fine-needle aspiration biopsy showed papillary thyroid carcinoma category 5.  She underwent total thyroidectomy and deep neck dissection on left side On March 29, 2021 by Dr. Benjamine Acevedo. Surgical sample showed left-sided thyroid malignancy measuring 1.4 cm and 0.2 cm, positive margins, 2 out of 11 lymph nodes positive. Pathology classification  mpT1B,PN1b. -She recently underwent Thyrogen stimulated remnant ablation with only expected uptake in the neck, absent distant metastasis.  Her most recent June 25, 2022 previsit surveillance thyroid ultrasound was consistent with surgical changes of total thyroidectomy without evidence of residual or recurrent thyroid tissue.  She did have visible that indeterminate 6 mm lymph node within the left thyroid resection bed.   Patient denies dysphagia or shortness of breath, nor voice  change.  She was subsequently put on levothyroxine for postsurgical hypothyroidism.  She is currently on levothyroxine 112 mcg p.o. daily before breakfast with good consistency and compliance. She has no new complaints today.      She has well-controlled hyperlipidemia.  She had some thyroid dysfunction in her mother.She denies any family history of thyroid malignancy.   She did not have significant goiter prior to her surgery or diagnosed with thyroid cancer.  She has previous lipoma surgery in 2014. She denies palpitations, tremors, heat intolerance.  She does have some mild degree of hot flashes.  She has recovered from thyroid surgery very well.  She also underwent lipoma surgery in the interval.   Review of Systems   Objective:       07/09/2022    8:36 AM 12/25/2021    8:49 AM 11/09/2021   11:54 AM  Vitals with BMI  Height '5\' 8"'$  '5\' 8"'$    Weight 234 lbs 6 oz 228 lbs   BMI 09.47 09.62   Systolic 836 629 476  Diastolic 76 84 74  Pulse 68 75 71    BP 118/76   Pulse 68   Ht '5\' 8"'$  (1.727 m)   Wt 234 lb 6.4 oz (106.3 kg)   BMI 35.64 kg/m   Wt Readings from Last 3 Encounters:  07/09/22 234 lb 6.4 oz (106.3 kg)  12/25/21 228 lb (103.4 kg)  11/09/21 223 lb 1.7 oz (101.2 kg)    Physical Exam  Constitutional:  Body mass index is 35.64 kg/m.,  not in acute distress, normal state of mind Eyes: PERRLA, EOMI, no exophthalmos ENT: moist mucous membranes, + still healing long surgical scar on left and lower anterior neck, no gross cervical lymphadenopathy -Status post lipoma resection from left supraclavicular area.   CMP ( most recent) CMP     Component Value Date/Time   NA 143 12/18/2021 0749   K 4.1 12/18/2021 0749   CL 103 12/18/2021 0749   CO2 26 12/18/2021 0749   GLUCOSE 90 12/18/2021 0749   GLUCOSE 102 (H) 02/28/2021 1147   BUN 18 12/18/2021 0749   CREATININE 1.12 (H) 12/18/2021 0749   CALCIUM 9.4 12/18/2021 0749   PROT 6.7 12/18/2021 0749   ALBUMIN 4.3  12/18/2021 0749   AST 31 12/18/2021 0749   ALT 36 (H) 12/18/2021 0749   ALKPHOS 114 12/18/2021 0749   BILITOT 0.4 12/18/2021 0749   GFRNONAA >60 02/28/2021 1147   GFRAA >60 11/06/2018 0200   Biopsy and surgical pathology findings are reviewed.  FINDINGS: Two small foci of mild to moderate uptake along the midline of the lower neck in the expected location of the thyroid bed are identified corresponding to expected residual functioning thyroid  tissue. No signs of tracer avid nodal metastasis or distant metastatic disease.   IMPRESSION: Expected residual functioning thyroid tissue within the thyroid bed status post thyroidectomy. No signs nodal metastasis or distant metastatic disease.  Recent Results (from the past 2160 hour(s))  TSH     Status: None   Collection Time: 06/25/22  9:13 AM  Result Value Ref Range   TSH 3.900 0.450 - 4.500 uIU/mL  T4, free     Status: None   Collection Time: 06/25/22  9:13 AM  Result Value Ref Range   Free T4 1.36 0.82 - 1.77 ng/dL  Thyroglobulin Level     Status: None   Collection Time: 06/25/22  9:13 AM  Result Value Ref Range   Thyroglobulin (TG-RIA) <2.0 ng/mL    Comment: This test was developed and its performance characteristics determined by [facility]. It has not been cleared or approved by the Food and Drug Administration. Reference Range: Pubertal Children and Adults: <40 According to the [facility] of Clinical Biochemistry, the reference interval for Thyroglobulin (TG) should be related to euthyroid patients and not for patients who underwent thyroidectomy.  TG reference intervals for these patients depend on the residual mass of the thyroid tissue left after surgery.  Establishing a post-operative baseline is recommended.  The assay quantitation limit is 2.0 ng/mL.     June 25, 2022 surveillance thyroid/neck ultrasound IMPRESSION: 1. Surgical changes of total thyroidectomy without evidence of residual or recurrent  thyroid tissue. 2. Visible but indeterminate 6 mm lymph node within the left thyroid resection bed. Recommend attention on follow-up imaging.    Assessment & Plan:   1. Postsurgical hypothyroidism 2. Malignant neoplasm of thyroid gland (HCC)  She is status post total thyroidectomy for recently diagnosed follicular variant papillary thyroid cancer multifocal, locally invasive including metastases to lymph nodes 2 out of 11 in the left cervical region status post total thyroidectomy and left neck dissection.   Considering pathology classification mpT1B,PN1b, multifocal nature of malignancy, relative youth of the patient, putting her at intermediate risk of tumor recurrence, she was considered for I-131 thyroid remnant ablation with posttherapy scan showing expected uptake in the neck, no distant metastasis on May 22, 2021.    June 25, 2022 thyroid ultrasound showed surgical changes of total thyroidectomy without evidence of  residual or recurrent thyroid tissue. Visible but indeterminate 6 mm lymph node within the left thyroid resection bed. Recommend attention on follow-up imaging.  Her previsit labs show undetectable thyroglobulin levels.  Regarding postsurgical hypothyroidism: Her most recent thyroid function tests are consistent with appropriate replacement.  She is advised to continue levothyroxine 112 mcg p.o. daily before breakfast.    I spent 30 minutes in the care of the patient today including review of labs from Thyroid Function, CMP, and other relevant labs ; imaging/biopsy records (current and previous including abstractions from other facilities); face-to-face time discussing  her lab results and symptoms, medications doses, her options of short and long term treatment based on the latest standards of care / guidelines;   and documenting the encounter.  Tricha Ruggirello  participated in the discussions, expressed understanding, and voiced agreement with the above plans.  All  questions were answered to her satisfaction. she is encouraged to contact clinic should she have any questions or concerns prior to her return visit.    Her recent labs show normal calcium of 9.4.  No need for calcium supplements.   She was previously given a package of lifestyle medicine for weight management.   -  she is advised to maintain close follow up with Claudia Noble, MD for primary care needs.   I spent 30 minutes in the care of the patient today including review of labs from Thyroid Function, CMP, and other relevant labs ; imaging/biopsy records (current and previous including abstractions from other facilities); face-to-face time discussing  her lab results and symptoms, medications doses, her options of short and long term treatment based on the latest standards of care / guidelines;   and documenting the encounter.  Theona Muhs  participated in the discussions, expressed understanding, and voiced agreement with the above plans.  All questions were answered to her satisfaction. she is encouraged to contact clinic should she have any questions or concerns prior to her return visit.   Follow up plan: Return in about 6 months (around 01/07/2023) for F/U with Pre-visit Labs.   Glade Lloyd, MD [facility] [address] [place], [place] [address] Phone: [phone]  Fax: [phone]     07/09/2022, 12:28 PM  This note was partially dictated with voice recognition software. Similar sounding words can be transcribed inadequately or may not  be corrected upon review.

## 2022-09-14 ENCOUNTER — Other Ambulatory Visit: Payer: Self-pay | Admitting: "Endocrinology

## 2022-09-17 ENCOUNTER — Other Ambulatory Visit: Payer: Self-pay

## 2022-09-17 MED ORDER — LEVOTHYROXINE SODIUM 112 MCG PO TABS: 112.0000 ug | ORAL_TABLET | Freq: Every day | ORAL | 1 refills | Status: DC

## 2022-12-31 ENCOUNTER — Telehealth: Payer: Self-pay | Admitting: "Endocrinology

## 2022-12-31 DIAGNOSIS — E890 Postprocedural hypothyroidism: Secondary | ICD-10-CM

## 2022-12-31 NOTE — Telephone Encounter (Signed)
Orders updated and sent to [facility].

## 2022-12-31 NOTE — Telephone Encounter (Signed)
Pt needs labs updated

## 2023-01-07 ENCOUNTER — Ambulatory Visit: Payer: BC Managed Care – PPO | Admitting: "Endocrinology

## 2023-02-13 LAB — THYROGLOBULIN LEVEL: Thyroglobulin (TG-RIA): <2 ng/mL

## 2023-02-13 LAB — TSH: TSH: 3.8 u[IU]/mL (ref 0.450–4.500)

## 2023-02-13 LAB — T4, FREE: Free T4: 1.58 ng/dL (ref 0.82–1.77)

## 2023-02-14 ENCOUNTER — Ambulatory Visit: Payer: BC Managed Care – PPO | Admitting: "Endocrinology

## 2023-02-14 ENCOUNTER — Encounter: Payer: Self-pay | Admitting: "Endocrinology

## 2023-02-14 VITALS — BP 138/84 | HR 68 | Ht 68.0 in | Wt 234.0 lb

## 2023-02-14 DIAGNOSIS — C73 Malignant neoplasm of thyroid gland: Secondary | ICD-10-CM

## 2023-02-14 DIAGNOSIS — E890 Postprocedural hypothyroidism: Secondary | ICD-10-CM | POA: Diagnosis not present

## 2023-02-14 MED ORDER — LEVOTHYROXINE SODIUM 125 MCG PO TABS: 125.0000 ug | ORAL_TABLET | Freq: Every day | ORAL | 1 refills | Status: DC

## 2023-02-14 NOTE — Progress Notes (Signed)
02/14/2023, 4:39 PM  Endocrinology follow-up note   Subjective:    Patient ID: Claudia Acevedo, female    DOB: 1965/12/26, PCP Carylon Perches, MD   Past Medical History:  Diagnosis Date   GERD (gastroesophageal reflux disease)    Hyperlipidemia 10/2020   Thyroid cancer    Thyroid disease    Past Surgical History:  Procedure Laterality Date   BREAST SURGERY  03/1986   lft fib removed from left breast   EYE SURGERY Bilateral 2000-2001   laser eye surgery for nearsightedness   LIPOMA EXCISION Left 10/19/2013   Procedure: EXCISION THIGH LIPOMA;  Surgeon: Robyne Askew, MD;  Location: [facility];  Service: General;  Laterality: Left;   MASS EXCISION Right 11/09/2021   Procedure: EXCISION RIGHT CLAVICULAR MASS;  Surgeon: Peggye Form, DO;  Location: [facility];  Service: Plastics;  Laterality: Right;   THYROIDECTOMY Left 03/29/2021   Procedure: TOTAL THYROIDECTOMY WITH LEFT NECK DISSECTION;  Surgeon: Newman Pies, MD;  Location: [facility];  Service: ENT;  Laterality: Left;   WISDOM TOOTH EXTRACTION  2004   Social History   Socioeconomic History   Marital status: Married    Spouse name: Not on file   Number of children: Not on file   Years of education: Not on file   Highest education level: Not on file  Occupational History   Not on file  Tobacco Use   Smoking status: Never   Smokeless tobacco: Never  Vaping Use   Vaping Use: Never used  Substance and Sexual Activity   Alcohol use: Yes    Comment: twice month   Drug use: No   Sexual activity: Yes    Birth control/protection: I.U.D.  Other Topics Concern   Not on file  Social History Narrative   Not on file   Social Determinants of Health   Financial Resource Strain: Not on file  Food Insecurity: Not on file  Transportation Needs: Not on file  Physical Activity: Not on file  Stress: Not on file  Social Connections: Not on file    Family History  Problem Relation Age of Onset   Cancer Mother    Thyroid disease Mother    Hyperlipidemia Mother    Hypertension Mother    ALS Father    Outpatient Encounter Medications as of 02/14/2023  Medication Sig   Lactobacillus (PROBIOTIC ACIDOPHILUS PO) Take 1 tablet by mouth daily.   atorvastatin (LIPITOR) 20 MG tablet Take 20 mg by mouth at bedtime.   levothyroxine (SYNTHROID) 125 MCG tablet Take 1 tablet (125 mcg total) by mouth daily before breakfast.   [DISCONTINUED] levothyroxine (SYNTHROID) 112 MCG tablet TAKE 1 TABLET(112 MCG) BY MOUTH DAILY BEFORE BREAKFAST FOR 180 DOSES   [DISCONTINUED] levothyroxine (SYNTHROID) 112 MCG tablet Take 1 tablet (112 mcg total) by mouth daily before breakfast for 180 doses.   No facility-administered encounter medications on file as of 02/14/2023.   ALLERGIES: No Known Allergies  VACCINATION STATUS:  There is no immunization history on file for this patient.  HPI Claudia Acevedo is 57 y.o. female who presents today with a medical history as above. she is being seen in follow-up after she was seen in  consultation for multifocal papillary thyroid cancer requested by Carylon Perches, MD.    See notes from last visit.  She is recovering from her recent thyroidectomy. -Fine-needle aspiration biopsy showed papillary thyroid carcinoma category 5.  She underwent total thyroidectomy and deep neck dissection on left side On March 29, 2021 by Dr. Suszanne Conners. Surgical sample showed left-sided thyroid malignancy measuring 1.4 cm and 0.2 cm, positive margins, 2 out of 11 lymph nodes positive. Pathology classification  mpT1B,PN1b. -Subsequent to her surgery, in July 2022, she underwent Thyrogen stimulated remnant ablation with only expected uptake in the neck, absent distant metastasis.  Her most recent June 25, 2022 previsit surveillance thyroid ultrasound was consistent with surgical changes of total thyroidectomy without evidence of residual or recurrent  thyroid tissue.  She did have visible that indeterminate 6 mm lymph node within the left thyroid resection bed.   Patient denies dysphagia or shortness of breath, nor voice change.  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  She is currently on 112 mcg p.o. daily before breakfast.   She reports optimal compliance and consistency with her medication.    She has well-controlled hyperlipidemia.  She had some thyroid dysfunction in her mother.She denies any family history of thyroid malignancy.   She did not have significant goiter prior to her surgery or diagnosed with thyroid cancer.  She has previous lipoma surgery in 2014. She denies palpitations, tremors, heat intolerance.  She does have some mild degree of hot flashes.  She has recovered from thyroid surgery very well.  She also underwent lipoma surgery in the interval.   Review of Systems   Objective:       02/14/2023   11:21 AM 07/09/2022    8:36 AM 12/25/2021    8:49 AM  Vitals with BMI  Height     Weight 234 lbs 234 lbs 6 oz 228 lbs  BMI 35.59 35.65 34.68  Systolic 138 118 604  Diastolic 84 76 84  Pulse 68 68 75    BP 138/84   Pulse 68   Ht  (1.727 m)   Wt 234 lb (106.1 kg)   BMI 35.58 kg/m   Wt Readings from Last 3 Encounters:  02/14/23 234 lb (106.1 kg)  07/09/22 234 lb 6.4 oz (106.3 kg)  12/25/21 228 lb (103.4 kg)    Physical Exam  Constitutional:  Body mass index is 35.58 kg/m.,  not in acute distress, normal state of mind Eyes: PERRLA, EOMI, no exophthalmos ENT: moist mucous membranes, + well-healed surgical scar on anterior lower neck,  no gross cervical lymphadenopathy -Status post lipoma resection from left supraclavicular area.   CMP ( most recent) CMP     Component Value Date/Time   NA 143 12/18/2021 0749   K 4.1 12/18/2021 0749   CL 103 12/18/2021 0749   CO2 26 12/18/2021 0749   GLUCOSE 90 12/18/2021 0749   GLUCOSE 102 (H) 02/28/2021 1147   BUN 18  12/18/2021 0749   CREATININE 1.12 (H) 12/18/2021 0749   CALCIUM 9.4 12/18/2021 0749   PROT 6.7 12/18/2021 0749   ALBUMIN 4.3 12/18/2021 0749   AST 31 12/18/2021 0749   ALT 36 (H) 12/18/2021 0749   ALKPHOS 114 12/18/2021 0749   BILITOT 0.4 12/18/2021 0749   GFRNONAA >60 02/28/2021 1147   GFRAA >60 11/06/2018 0200   Biopsy and surgical pathology findings are reviewed.  FINDINGS: Two small foci of mild to moderate uptake along the midline of the lower neck  in the expected location of the thyroid bed are identified corresponding to expected residual functioning thyroid tissue. No signs of tracer avid nodal metastasis or distant metastatic disease.   IMPRESSION: Expected residual functioning thyroid tissue within the thyroid bed status post thyroidectomy. No signs nodal metastasis or distant metastatic disease.  Recent Results (from the past 2160 hour(s))  Thyroglobulin Level     Status: None   Collection Time: 02/06/23  2:47 PM  Result Value Ref Range   Thyroglobulin (TG-RIA) <2.0 ng/mL    Comment: This test was developed and its performance characteristics determined by [facility]. It has not been cleared or approved by the Food and Drug Administration. Reference Range: Pubertal Children and Adults: <40 According to the [facility] of Clinical Biochemistry, the reference interval for Thyroglobulin (TG) should be related to euthyroid patients and not for patients who underwent thyroidectomy.  TG reference intervals for these patients depend on the residual mass of the thyroid tissue left after surgery.  Establishing a post-operative baseline is recommended.  The assay quantitation limit is 2.0 ng/mL.   T4, Free     Status: None   Collection Time: 02/06/23  2:47 PM  Result Value Ref Range   Free T4 1.58 0.82 - 1.77 ng/dL  TSH     Status: None   Collection Time: 02/06/23  2:47 PM  Result Value Ref Range   TSH 3.800 0.450 - 4.500 uIU/mL    June 25, 2022  surveillance thyroid/neck ultrasound IMPRESSION: 1. Surgical changes of total thyroidectomy without evidence of residual or recurrent thyroid tissue. 2. Visible but indeterminate 6 mm lymph node within the left thyroid resection bed. Recommend attention on follow-up imaging.    Assessment & Plan:   1. Postsurgical hypothyroidism 2. Malignant neoplasm of thyroid gland (HCC)  She is status post total thyroidectomy for recently diagnosed follicular variant papillary thyroid cancer multifocal, locally invasive including metastases to lymph nodes 2 out of 11 in the left cervical region status post total thyroidectomy and left neck dissection.   Considering pathology classification mpT1B,PN1b, multifocal nature of malignancy, relative youth of the patient, putting her at intermediate risk of tumor recurrence, she was considered for I-131 thyroid remnant ablation with posttherapy scan showing expected uptake in the neck, no distant metastasis on May 22, 2021.    June 25, 2022 thyroid ultrasound showed surgical changes of total thyroidectomy without evidence of  residual or recurrent thyroid tissue. Visible but indeterminate 6 mm lymph node within the left thyroid resection bed. Recommend attention on follow-up imaging.  Her previsit labs show undetectable thyroglobulin levels.  She will be considered for thyroid/neck ultrasound after her next visit.  Regarding postsurgical hypothyroidism: Her most recent thyroid function tests are such that she will benefit from slight increase in her levothyroxine.  I discussed and increased her levothyroxine to 125 mcg p.o. daily before breakfast.     - We discussed about the correct intake of her thyroid hormone, on empty stomach at fasting, with water, separated by at least 30 minutes from breakfast and other medications,  and separated by more than 4 hours from calcium, iron, multivitamins, acid reflux medications (PPIs). -Patient is made aware of the fact  that thyroid hormone replacement is needed for life, dose to be adjusted by periodic monitoring of thyroid function tests. Her recent labs show normal calcium of 9.4.  No need for calcium supplements. She was previously given a package of lifestyle medicine for weight management.  - she is advised to maintain close  follow up with Carylon Perches, MD for primary care needs.   I spent  22  minutes in the care of the patient today including review of labs from Thyroid Function, CMP, and other relevant labs ; imaging/biopsy records (current and previous including abstractions from other facilities); face-to-face time discussing  her lab results and symptoms, medications doses, her options of short and long term treatment based on the latest standards of care / guidelines;   and documenting the encounter.  Opaline Reyburn  participated in the discussions, expressed understanding, and voiced agreement with the above plans.  All questions were answered to her satisfaction. she is encouraged to contact clinic should she have any questions or concerns prior to her return visit.    Follow up plan: Return in about 6 months (around 08/16/2023) for F/U with Pre-visit Labs.   Marquis Lunch, MD [facility] [address] [place], [place] [address] Phone: [phone]  Fax: [phone]     02/14/2023, 4:39 PM  This note was partially dictated with voice recognition software. Similar sounding words can be transcribed inadequately or may not  be corrected upon review.

## 2023-08-15 ENCOUNTER — Other Ambulatory Visit: Payer: Self-pay | Admitting: "Endocrinology

## 2023-09-02 ENCOUNTER — Encounter: Payer: Self-pay | Admitting: "Endocrinology

## 2023-09-02 ENCOUNTER — Ambulatory Visit: Payer: BC Managed Care – PPO | Admitting: "Endocrinology

## 2023-09-02 VITALS — BP 132/82 | HR 72 | Ht 68.0 in | Wt 231.2 lb

## 2023-09-02 DIAGNOSIS — C73 Malignant neoplasm of thyroid gland: Secondary | ICD-10-CM

## 2023-09-02 DIAGNOSIS — E890 Postprocedural hypothyroidism: Secondary | ICD-10-CM

## 2023-09-02 NOTE — Progress Notes (Signed)
09/02/2023, 3:28 PM  Endocrinology follow-up note   Subjective:    Patient ID: Claudia Acevedo, female    DOB: 1965/11/29, PCP Carylon Perches, MD   Past Medical History:  Diagnosis Date   GERD (gastroesophageal reflux disease)    Hyperlipidemia 10/2020   Thyroid cancer [facility])    Thyroid disease    Past Surgical History:  Procedure Laterality Date   BREAST SURGERY  03/1986   lft fib removed from left breast   EYE SURGERY Bilateral 2000-2001   laser eye surgery for nearsightedness   LIPOMA EXCISION Left 10/19/2013   Procedure: EXCISION THIGH LIPOMA;  Surgeon: Robyne Askew, MD;  Location: [facility];  Service: General;  Laterality: Left;   MASS EXCISION Right 11/09/2021   Procedure: EXCISION RIGHT CLAVICULAR MASS;  Surgeon: Peggye Form, DO;  Location:  SURGERY CENTER;  Service: Plastics;  Laterality: Right;   THYROIDECTOMY Left 03/29/2021   Procedure: TOTAL THYROIDECTOMY WITH LEFT NECK DISSECTION;  Surgeon: Newman Pies, MD;  Location: [facility];  Service: ENT;  Laterality: Left;   WISDOM TOOTH EXTRACTION  2004   Social History   Socioeconomic History   Marital status: Married    Spouse name: Not on file   Number of children: Not on file   Years of education: Not on file   Highest education level: Not on file  Occupational History   Not on file  Tobacco Use   Smoking status: Never   Smokeless tobacco: Never  Vaping Use   Vaping status: Never Used  Substance and Sexual Activity   Alcohol use: Yes    Comment: twice month   Drug use: No   Sexual activity: Yes    Birth control/protection: I.U.D.  Other Topics Concern   Not on file  Social History Narrative   Not on file   Social Determinants of Health   Financial Resource Strain: Not on file  Food Insecurity: Not on file  Transportation Needs: Not on file  Physical Activity: Not on file  Stress: Not on file  Social Connections: Unknown  (03/13/2022)   Received from [facility], [facility]   Social Network    Social Network: Not on file   Family History  Problem Relation Age of Onset   Cancer Mother    Thyroid disease Mother    Hyperlipidemia Mother    Hypertension Mother    ALS Father    Outpatient Encounter Medications as of 09/02/2023  Medication Sig   atorvastatin (LIPITOR) 20 MG tablet Take 20 mg by mouth at bedtime.   Lactobacillus (PROBIOTIC ACIDOPHILUS PO) Take 1 tablet by mouth daily.   levothyroxine (SYNTHROID) 125 MCG tablet TAKE 1 TABLET(125 MCG) BY MOUTH DAILY BEFORE BREAKFAST   No facility-administered encounter medications on file as of 09/02/2023.   ALLERGIES: No Known Allergies  VACCINATION STATUS:  There is no immunization history on file for this patient.  HPI Claudia Acevedo is 57 y.o. female who presents today with a medical history as above. she is being seen in follow-up after she was seen in consultation for multifocal papillary thyroid cancer requested by Carylon Perches, MD.    See notes from last visit.  She is recovering from  her recent thyroidectomy. -Fine-needle aspiration biopsy showed papillary thyroid carcinoma category 5.  She underwent total thyroidectomy and deep neck dissection on left side On March 29, 2021 by Dr. Suszanne Conners. Surgical sample showed left-sided thyroid malignancy measuring 1.4 cm and 0.2 cm, positive margins, 2 out of 11 lymph nodes positive. Pathology classification  mpT1B,PN1b. -Subsequent to her surgery, in July 2022, she underwent Thyrogen stimulated remnant ablation with only expected uptake in the neck, absent distant metastasis.  Her most recent June 25, 2022 previsit surveillance thyroid ultrasound was consistent with surgical changes of total thyroidectomy without evidence of residual or recurrent thyroid tissue.  She did have visible that indeterminate 6 mm lymph node within the left thyroid resection bed.   Patient denies dysphagia or shortness of breath, nor  voice change.    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  She is currently on levothyroxine 125 mcg p.o. daily before breakfast.    She reports optimal compliance and consistency with her medication.    She has well-controlled hyperlipidemia.  She had some thyroid dysfunction in her mother.She denies any family history of thyroid malignancy.   She did not have significant goiter prior to her surgery or diagnosed with thyroid cancer.  She has previous lipoma surgery in 2014. She denies palpitations, tremors, heat intolerance.  She does have some mild degree of hot flashes.  She has recovered from thyroid surgery very well.  She also underwent lipoma surgery in the interval.   Review of Systems   Objective:       09/02/2023    8:58 AM 02/14/2023   11:21 AM 07/09/2022    8:36 AM  Vitals with BMI  Height 5\' 8"  5\' 8"  5\' 8"   Weight 231 lbs 3 oz 234 lbs 234 lbs 6 oz  BMI 35.16 35.59 35.65  Systolic 132 138 161  Diastolic 82 84 76  Pulse 72 68 68    BP 132/82   Pulse 72   Ht 5\' 8"  (1.727 m)   Wt 231 lb 3.2 oz (104.9 kg)   BMI 35.15 kg/m   Wt Readings from Last 3 Encounters:  09/02/23 231 lb 3.2 oz (104.9 kg)  02/14/23 234 lb (106.1 kg)  07/09/22 234 lb 6.4 oz (106.3 kg)    Physical Exam  Constitutional:  Body mass index is 35.15 kg/m.,  not in acute distress, normal state of mind Eyes: PERRLA, EOMI, no exophthalmos ENT: moist mucous membranes, + well-healed surgical scar on anterior lower neck,  no gross cervical lymphadenopathy -Status post lipoma resection from left supraclavicular area.   CMP ( most recent) CMP     Component Value Date/Time   NA 143 12/18/2021 0749   K 4.1 12/18/2021 0749   CL 103 12/18/2021 0749   CO2 26 12/18/2021 0749   GLUCOSE 90 12/18/2021 0749   GLUCOSE 102 (H) 02/28/2021 1147   BUN 18 12/18/2021 0749   CREATININE 1.12 (H) 12/18/2021 0749   CALCIUM 9.4 12/18/2021 0749   PROT 6.7 12/18/2021 0749   ALBUMIN 4.3  12/18/2021 0749   AST 31 12/18/2021 0749   ALT 36 (H) 12/18/2021 0749   ALKPHOS 114 12/18/2021 0749   BILITOT 0.4 12/18/2021 0749   GFRNONAA >60 02/28/2021 1147   GFRAA >60 11/06/2018 0200   Biopsy and surgical pathology findings are reviewed.  FINDINGS: Two small foci of mild to moderate uptake along the midline of the lower neck in the expected location of the thyroid bed are identified corresponding to expected residual  functioning thyroid tissue. No signs of tracer avid nodal metastasis or distant metastatic disease.   IMPRESSION: Expected residual functioning thyroid tissue within the thyroid bed status post thyroidectomy. No signs nodal metastasis or distant metastatic disease.  Recent Results (from the past 2160 hour(s))  Thyroglobulin Level     Status: None (Preliminary result)   Collection Time: 08/26/23  2:25 PM  Result Value Ref Range   Thyroglobulin (TG-RIA) WILL FOLLOW   Thyroglobulin antibody     Status: None   Collection Time: 08/26/23  2:25 PM  Result Value Ref Range   Thyroglobulin Antibody <1.0 0.0 - 0.9 IU/mL    Comment: Thyroglobulin Antibody measured by [employer] Methodology It should be noted that the presence of thyroglobulin antibodies may not be pathogenic nor diagnostic, especially at very low levels. The assay manufacturer has found that four percent of individuals without evidence of thyroid disease or autoimmunity will have positive TgAb levels up to 4 IU/mL.   TSH     Status: None   Collection Time: 08/26/23  2:25 PM  Result Value Ref Range   TSH 1.350 0.450 - 4.500 uIU/mL  T4, free     Status: None   Collection Time: 08/26/23  2:25 PM  Result Value Ref Range   Free T4 1.70 0.82 - 1.77 ng/dL    June 25, 2022 surveillance thyroid/neck ultrasound IMPRESSION: 1. Surgical changes of total thyroidectomy without evidence of residual or recurrent thyroid tissue. 2. Visible but indeterminate 6 mm lymph node within the left  thyroid resection bed. Recommend attention on follow-up imaging.    Assessment & Plan:   1. Postsurgical hypothyroidism 2. Malignant neoplasm of thyroid gland (HCC)  She is status post total thyroidectomy for recently diagnosed follicular variant papillary thyroid cancer multifocal, locally invasive including metastases to lymph nodes 2 out of 11 in the left cervical region status post total thyroidectomy and left neck dissection.   Considering pathology classification mpT1B,PN1b, multifocal nature of malignancy, relative youth of the patient, putting her at intermediate risk of tumor recurrence, she was considered for I-131 thyroid remnant ablation with posttherapy scan showing expected uptake in the neck, no distant metastasis on May 22, 2021.    June 25, 2022 thyroid ultrasound showed surgical changes of total thyroidectomy without evidence of  residual or recurrent thyroid tissue. Visible but indeterminate 6 mm lymph node within the left thyroid resection bed. Recommend attention on follow-up imaging.  Her previsit labs show undetectable thyroglobulin levels.  She will be offered CT scan of neck with and without contrast as well as chest with and without contrast before her next visit.     Regarding postsurgical hypothyroidism: Her most recent thyroid function tests are consistent with appropriate replacement.  She is advised to continue levothyroxine 125 mcg p.o. daily before breakfast.     - We discussed about the correct intake of her thyroid hormone, on empty stomach at fasting, with water, separated by at least 30 minutes from breakfast and other medications,  and separated by more than 4 hours from calcium, iron, multivitamins, acid reflux medications (PPIs). -Patient is made aware of the fact that thyroid hormone replacement is needed for life, dose to be adjusted by periodic monitoring of thyroid function tests.  - she is advised to maintain close follow up with Carylon Perches, MD  for primary care needs.   I spent  25  minutes in the care of the patient today including review of labs from Thyroid Function, CMP, and other relevant  labs ; imaging/biopsy records (current and previous including abstractions from other facilities); face-to-face time discussing  her lab results and symptoms, medications doses, her options of short and long term treatment based on the latest standards of care / guidelines;   and documenting the encounter.  Mikayela Deats  participated in the discussions, expressed understanding, and voiced agreement with the above plans.  All questions were answered to her satisfaction. she is encouraged to contact clinic should she have any questions or concerns prior to her return visit.   Follow up plan: Return in about 6 months (around 03/01/2024) for F/U with Pre-visit Labs.   Marquis Lunch, MD [facility] [address] [place], [place] [address] Phone: [phone]  Fax: [phone]     09/02/2023, 3:28 PM  This note was partially dictated with voice recognition software. Similar sounding words can be transcribed inadequately or may not  be corrected upon review.

## 2023-09-09 LAB — T4, FREE: Free T4: 1.7 ng/dL (ref 0.82–1.77)

## 2023-09-09 LAB — THYROGLOBULIN ANTIBODY: Thyroglobulin Antibody: <1 [IU]/mL (ref 0.0–0.9)

## 2023-09-09 LAB — THYROGLOBULIN LEVEL: Thyroglobulin (TG-RIA): <2 ng/mL

## 2023-09-09 LAB — TSH: TSH: 1.35 u[IU]/mL (ref 0.450–4.500)

## 2023-11-11 ENCOUNTER — Other Ambulatory Visit: Payer: Self-pay | Admitting: "Endocrinology

## 2024-01-22 ENCOUNTER — Encounter (HOSPITAL_COMMUNITY): Payer: Self-pay | Admitting: Radiology

## 2024-01-22 ENCOUNTER — Ambulatory Visit (HOSPITAL_COMMUNITY)
Admission: RE | Admit: 2024-01-22 | Discharge: 2024-01-22 | Disposition: A | Discharge status: Home / Self Care | Source: Ambulatory Visit | Attending: "Endocrinology | Admitting: "Endocrinology

## 2024-01-22 DIAGNOSIS — E890 Postprocedural hypothyroidism: Secondary | ICD-10-CM | POA: Insufficient documentation

## 2024-01-22 MED ORDER — IOHEXOL 300 MG/ML  SOLN
100.0000 mL | Freq: Once | INTRAMUSCULAR | Status: AC | PRN
  Administered 2024-01-22: 100 mL via INTRAVENOUS

## 2024-01-30 ENCOUNTER — Other Ambulatory Visit: Payer: Self-pay | Admitting: *Deleted

## 2024-01-30 ENCOUNTER — Telehealth: Payer: Self-pay | Admitting: "Endocrinology

## 2024-01-30 DIAGNOSIS — E890 Postprocedural hypothyroidism: Secondary | ICD-10-CM

## 2024-01-30 DIAGNOSIS — C73 Malignant neoplasm of thyroid gland: Secondary | ICD-10-CM

## 2024-01-30 NOTE — Telephone Encounter (Signed)
 Labs are updated.

## 2024-01-30 NOTE — Telephone Encounter (Signed)
Update Labs

## 2024-03-09 ENCOUNTER — Ambulatory Visit: Payer: BC Managed Care – PPO | Admitting: "Endocrinology

## 2024-03-30 ENCOUNTER — Encounter: Payer: Self-pay | Admitting: "Endocrinology

## 2024-03-30 ENCOUNTER — Ambulatory Visit: Admitting: "Endocrinology

## 2024-03-30 VITALS — BP 122/84 | HR 72 | Ht 68.0 in | Wt 227.6 lb

## 2024-03-30 DIAGNOSIS — C73 Malignant neoplasm of thyroid gland: Secondary | ICD-10-CM | POA: Diagnosis not present

## 2024-03-30 DIAGNOSIS — E890 Postprocedural hypothyroidism: Secondary | ICD-10-CM | POA: Diagnosis not present

## 2024-03-30 NOTE — Progress Notes (Signed)
 03/30/2024, 4:06 PM  Endocrinology follow-up note   Subjective:    Patient ID: Claudia Acevedo, female    DOB: 02/04/1966, PCP Artemisa Bile, MD   Past Medical History:  Diagnosis Date   GERD (gastroesophageal reflux disease)    Hyperlipidemia 10/2020   Thyroid  cancer [facility])    Thyroid  disease    Past Surgical History:  Procedure Laterality Date   BREAST SURGERY  03/1986   lft fib removed from left breast   EYE SURGERY Bilateral 2000-2001   laser eye surgery for nearsightedness   LIPOMA EXCISION Left 10/19/2013   Procedure: EXCISION THIGH LIPOMA;  Surgeon: Mayme Spearman, MD;  Location: [facility];  Service: General;  Laterality: Left;   MASS EXCISION Right 11/09/2021   Procedure: EXCISION RIGHT CLAVICULAR MASS;  Surgeon: Thornell Flirt, DO;  Location: [facility];  Service: Plastics;  Laterality: Right;   THYROIDECTOMY Left 03/29/2021   Procedure: TOTAL THYROIDECTOMY WITH LEFT NECK DISSECTION;  Surgeon: Reynold Caves, MD;  Location: [facility];  Service: ENT;  Laterality: Left;   WISDOM TOOTH EXTRACTION  2004   Social History   Socioeconomic History   Marital status: Married    Spouse name: Not on file   Number of children: Not on file   Years of education: Not on file   Highest education level: Not on file  Occupational History   Not on file  Tobacco Use   Smoking status: Never   Smokeless tobacco: Never  Vaping Use   Vaping status: Never Used  Substance and Sexual Activity   Alcohol use: Yes    Comment: twice month   Drug use: No   Sexual activity: Yes    Birth control/protection: I.U.D.  Other Topics Concern   Not on file  Social History Narrative   Not on file   Social Drivers of Health   Financial Resource Strain: Not on file  Food Insecurity: Not on file  Transportation Needs: Not on file  Physical Activity: Not on file  Stress: Not on file  Social Connections: Unknown  (03/13/2022)   Received from [facility], [facility]   Social Network    Social Network: Not on file   Family History  Problem Relation Age of Onset   Cancer Mother    Thyroid  disease Mother    Hyperlipidemia Mother    Hypertension Mother    ALS Father    Outpatient Encounter Medications as of 03/30/2024  Medication Sig   atorvastatin  (LIPITOR) 20 MG tablet Take 20 mg by mouth at bedtime.   Lactobacillus (PROBIOTIC ACIDOPHILUS PO) Take 1 tablet by mouth daily.   levothyroxine  (SYNTHROID ) 125 MCG tablet TAKE 1 TABLET(125 MCG) BY MOUTH DAILY BEFORE BREAKFAST   No facility-administered encounter medications on file as of 03/30/2024.   ALLERGIES: No Known Allergies  VACCINATION STATUS:  There is no immunization history on file for this patient.  HPI [name] is 58 y.o. female who presents today with a medical history as above. she is being seen in follow-up after she was seen in consultation for multifocal papillary thyroid  cancer requested by Artemisa Bile, MD.    See notes from last visit.  She is recovering from  her recent thyroidectomy. -Fine-needle aspiration biopsy showed papillary thyroid  carcinoma category 5.  She underwent total thyroidectomy and deep neck dissection on left side On March 29, 2021 by Dr. Darlin Ehrlich. Surgical sample showed left-sided thyroid  malignancy measuring 1.4 cm and 0.2 cm, positive margins, 2 out of 11 lymph nodes positive. Pathology classification  mpT1B,PN1b. -Subsequent to her surgery, in July 2022, she underwent Thyrogen  stimulated remnant ablation with only expected uptake in the neck, absent distant metastasis.  Her most recent June 25, 2022 previsit surveillance thyroid  ultrasound was consistent with surgical changes of total thyroidectomy without evidence of residual or recurrent thyroid  tissue.  She did have  indeterminate 6 mm lymph node within the left thyroid  resection bed.   She was offered surveillance CT neck/chest during her last visit.   She returns to discuss  her results.  See below.   Patient denies dysphagia or shortness of breath, nor voice change.  She remains on levothyroxine  for postsurgical hypothyroidism.  She is currently on levothyroxine  125 mcg p.o. daily before breakfast.  She did not have previsit thyroid  function tests.  She reports optimal compliance and consistency with her medication.    She has well-controlled hyperlipidemia.  She had some thyroid  dysfunction in her mother.She denies any family history of thyroid  malignancy.   She did not have significant goiter prior to her surgery or diagnosed with thyroid  cancer.  She has previous lipoma surgery in 2014. She denies palpitations, tremors, heat intolerance.  She does have some mild degree of hot flashes.  She has recovered from thyroid  surgery very well.  She also underwent lipoma surgery in the interval.   Review of Systems   Objective:       03/30/2024    9:17 AM 09/02/2023    8:58 AM 02/14/2023   11:21 AM  Vitals with BMI  Height 5\' 8"  5\' 8"  5\' 8"   Weight 227 lbs 10 oz 231 lbs 3 oz 234 lbs  BMI 34.61 35.16 35.59  Systolic 122 132 621  Diastolic 84 82 84  Pulse 72 72 68    BP 122/84   Pulse 72   Ht 5\' 8"  (1.727 m)   Wt 227 lb 9.6 oz (103.2 kg)   BMI 34.61 kg/m   Wt Readings from Last 3 Encounters:  03/30/24 227 lb 9.6 oz (103.2 kg)  09/02/23 231 lb 3.2 oz (104.9 kg)  02/14/23 234 lb (106.1 kg)    Physical Exam  Constitutional:  Body mass index is 34.61 kg/m.,  not in acute distress, normal state of mind Eyes: PERRLA, EOMI, no exophthalmos ENT: moist mucous membranes, + well-healed surgical scar on anterior lower neck,  no gross cervical lymphadenopathy -Status post lipoma resection from left supraclavicular area.   CMP ( most recent) CMP     Component Value Date/Time   NA 143 12/18/2021 0749   K 4.1 12/18/2021 0749   CL 103 12/18/2021 0749   CO2 26 12/18/2021 0749   GLUCOSE 90 12/18/2021 0749   GLUCOSE 102 (H) 02/28/2021  1147   BUN 18 12/18/2021 0749   CREATININE 1.12 (H) 12/18/2021 0749   CALCIUM  9.4 12/18/2021 0749   PROT 6.7 12/18/2021 0749   ALBUMIN 4.3 12/18/2021 0749   AST 31 12/18/2021 0749   ALT 36 (H) 12/18/2021 0749   ALKPHOS 114 12/18/2021 0749   BILITOT 0.4 12/18/2021 0749   GFRNONAA >60 02/28/2021 1147   GFRAA >60 11/06/2018 0200   Biopsy and surgical pathology findings are reviewed.  FINDINGS: Two small  foci of mild to moderate uptake along the midline of the lower neck in the expected location of the thyroid  bed are identified corresponding to expected residual functioning thyroid  tissue. No signs of tracer avid nodal metastasis or distant metastatic disease.   IMPRESSION: Expected residual functioning thyroid  tissue within the thyroid  bed status post thyroidectomy. No signs nodal metastasis or distant metastatic disease.  Recent Results (from the past 2160 hours)  Thyroglobulin antibody     Status: None   Collection Time: 03/24/24  8:34 AM  Result Value Ref Range   Thyroglobulin Antibody <1.0 0.0 - 0.9 IU/mL    Comment: Thyroglobulin Antibody measured by [employer] Methodology It should be noted that the presence of thyroglobulin antibodies may not be pathogenic nor diagnostic, especially at very low levels. The assay manufacturer has found that four percent of individuals without evidence of thyroid  disease or autoimmunity will have positive TgAb levels up to 4 IU/mL.   Thyroglobulin Level     Status: None (Preliminary result)   Collection Time: 03/24/24  8:34 AM  Result Value Ref Range   Thyroglobulin (TG-RIA) WILL FOLLOW     June 25, 2022 surveillance thyroid /neck ultrasound IMPRESSION: 1. Surgical changes of total thyroidectomy without evidence of residual or recurrent thyroid  tissue. 2. Visible but indeterminate 6 mm lymph node within the left thyroid  resection bed. Recommend attention on follow-up imaging.   Chest CT on January 22, 2024 IMPRESSION: 1.  Status post thyroidectomy. No CT evidence for suspicious pulmonary nodule or abnormal adenopathy within the chest. 2. Scattered sclerotic foci within the spine, nonspecific but given history of malignancy, suggest correlation with bone scan. 3. 13 mm hypodensity within the anterior dome of the liver, not entirely consistent with simple cyst. Correlation with MRI may be obtained for further assessment.  CT soft tissue neck with contrast on January 22, 2024 IMPRESSION: 1. Satisfactory post treatment CT appearance of the Neck. Absent thyroid  and regressed/resolved left level 4 lymphadenopathy since 2022. 2. Partial right parotid gland atrophy since 2022. 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 3. Chest CT the same reported separately.  Assessment & Plan:   1. Postsurgical hypothyroidism 2. Malignant neoplasm of thyroid  gland (HCC)  She is status post total thyroidectomy for recently diagnosed follicular variant papillary thyroid  cancer multifocal, locally invasive including metastases to lymph nodes 2 out of 11 in the left cervical region status post total thyroidectomy and left neck dissection.   Considering pathology classification mpT1B,PN1b, multifocal nature of malignancy, relative youth of the patient, putting her at intermediate risk of tumor recurrence, she was considered for I-131 thyroid  remnant ablation with posttherapy scan showing expected uptake in the neck, no distant metastasis on May 22, 2021.    June 25, 2022 thyroid  ultrasound showed surgical changes of total thyroidectomy without evidence of  residual or recurrent thyroid  tissue. Visible but indeterminate 6 mm lymph node within the left thyroid  resection bed.  Her subsequent CT chest showed abnormal lytic lesions on the spine.  She is offered radionuclide bone scan to assess better. Her previsit thyroglobulin's are still pending, does have undetectable thyroid  blood antibodies.  Her CT neck is negative for  residual thyroid /neck disease recurrence.   Regarding postsurgical hypothyroidism: Her most recent labs did not include TSH/free T4.  She is advised to continue levothyroxine  125 mcg p.o. daily before breakfast.      - We discussed about the correct intake of her thyroid  hormone, on empty stomach at fasting, with water , separated by at least 30  minutes from breakfast and other medications,  and separated by more than 4 hours from calcium , iron, multivitamins, acid reflux medications (PPIs). -Patient is made aware of the fact that thyroid  hormone replacement is needed for life, dose to be adjusted by periodic monitoring of thyroid  function tests.   - she is advised to maintain close follow up with Artemisa Bile, MD for primary care needs.  I spent  21  minutes in the care of the patient today including review of labs from Thyroid  Function, CMP, and other relevant labs ; imaging/biopsy records (current and previous including abstractions from other facilities); face-to-face time discussing  her lab results and symptoms, medications doses, her options of short and long term treatment based on the latest standards of care / guidelines;   and documenting the encounter.  Toluwani Ruder  participated in the discussions, expressed understanding, and voiced agreement with the above plans.  All questions were answered to her satisfaction. she is encouraged to contact clinic should she have any questions or concerns prior to her return visit.    Follow up plan: Return in about 3 weeks (around 04/20/2024), or and Bone Scan, for F/U with Pre-visit Labs.   Kalvin Orf, MD [facility] [address] [place], [place] [address] Phone: [phone]  Fax: [phone]     03/30/2024, 4:06 PM  This note was partially dictated with voice recognition software. Similar sounding words can be transcribed inadequately or may not  be corrected upon review.

## 2024-03-31 LAB — THYROGLOBULIN ANTIBODY: Thyroglobulin Antibody: <1 [IU]/mL (ref 0.0–0.9)

## 2024-03-31 LAB — THYROGLOBULIN LEVEL: Thyroglobulin (TG-RIA): <2 ng/mL

## 2024-04-07 ENCOUNTER — Ambulatory Visit (HOSPITAL_COMMUNITY)
Admission: RE | Admit: 2024-04-07 | Discharge: 2024-04-07 | Disposition: A | Discharge status: Home / Self Care | Source: Ambulatory Visit | Attending: "Endocrinology | Admitting: "Endocrinology

## 2024-04-07 ENCOUNTER — Encounter (HOSPITAL_COMMUNITY): Payer: Self-pay

## 2024-04-07 DIAGNOSIS — Z85850 Personal history of malignant neoplasm of thyroid: Secondary | ICD-10-CM | POA: Diagnosis not present

## 2024-04-07 DIAGNOSIS — C73 Malignant neoplasm of thyroid gland: Secondary | ICD-10-CM | POA: Diagnosis present

## 2024-04-07 MED ORDER — TECHNETIUM TC 99M MEDRONATE IV KIT
20.0000 | PACK | Freq: Once | INTRAVENOUS | Status: AC | PRN
  Administered 2024-04-07: 21 via INTRAVENOUS

## 2024-04-16 LAB — TSH: TSH: 1.51 u[IU]/mL (ref 0.450–4.500)

## 2024-04-16 LAB — T4, FREE: Free T4: 1.84 ng/dL — ABNORMAL HIGH (ref 0.82–1.77)

## 2024-04-20 ENCOUNTER — Ambulatory Visit: Admitting: "Endocrinology

## 2024-04-21 ENCOUNTER — Ambulatory Visit: Admitting: "Endocrinology

## 2024-04-21 ENCOUNTER — Encounter: Payer: Self-pay | Admitting: "Endocrinology

## 2024-04-21 VITALS — BP 122/84 | HR 84 | Ht 68.0 in | Wt 227.6 lb

## 2024-04-21 DIAGNOSIS — C73 Malignant neoplasm of thyroid gland: Secondary | ICD-10-CM

## 2024-04-21 DIAGNOSIS — E890 Postprocedural hypothyroidism: Secondary | ICD-10-CM | POA: Diagnosis not present

## 2024-04-21 MED ORDER — LEVOTHYROXINE SODIUM 112 MCG PO TABS: 112.0000 ug | ORAL_TABLET | Freq: Every day | ORAL | 1 refills | Status: DC

## 2024-04-21 NOTE — Progress Notes (Signed)
 04/21/2024, 6:55 PM  Endocrinology follow-up note   Subjective:    Patient ID: Claudia Acevedo, female    DOB: 07-05-1966, PCP Claudia Carwin, MD   Past Medical History:  Diagnosis Date   GERD (gastroesophageal reflux disease)    Hyperlipidemia 10/2020   Thyroid  cancer (HCC)    Thyroid  disease    Past Surgical History:  Procedure Laterality Date   BREAST SURGERY  03/1986   lft fib removed from left breast   EYE SURGERY Bilateral 2000-2001   laser eye surgery for nearsightedness   LIPOMA EXCISION Left 10/19/2013   Procedure: EXCISION THIGH LIPOMA;  Surgeon: Claudia Acevedo Curvin DOUGLAS, MD;  Location: [facility];  Service: General;  Laterality: Left;   MASS EXCISION Right 11/09/2021   Procedure: EXCISION RIGHT CLAVICULAR MASS;  Surgeon: Claudia Claudia GORMAN, DO;  Location: [facility];  Service: Plastics;  Laterality: Right;   THYROIDECTOMY Left 03/29/2021   Procedure: TOTAL THYROIDECTOMY WITH LEFT NECK DISSECTION;  Surgeon: Claudia Clunes, MD;  Location: [facility];  Service: ENT;  Laterality: Left;   WISDOM TOOTH EXTRACTION  2004   Social History   Socioeconomic History   Marital status: Married    Spouse name: Not on file   Number of children: Not on file   Years of education: Not on file   Highest education level: Not on file  Occupational History   Not on file  Tobacco Use   Smoking status: Never   Smokeless tobacco: Never  Vaping Use   Vaping status: Never Used  Substance and Sexual Activity   Alcohol use: Yes    Comment: twice month   Drug use: No   Sexual activity: Yes    Birth control/protection: I.U.D.  Other Topics Concern   Not on file  Social History Narrative   Not on file   Social Drivers of Health   Financial Resource Strain: Not on file  Food Insecurity: Not on file  Transportation Needs: Not on file  Physical Activity: Not on file  Stress: Not on file  Social Connections: Unknown  (03/13/2022)   Received from [facility]   Social Network    Social Network: Not on file   Family History  Problem Relation Age of Onset   Cancer Mother    Thyroid  disease Mother    Hyperlipidemia Mother    Hypertension Mother    ALS Father    Outpatient Encounter Medications as of 04/21/2024  Medication Sig   atorvastatin  (LIPITOR) 20 MG tablet Take 20 mg by mouth at bedtime.   Lactobacillus (PROBIOTIC ACIDOPHILUS PO) Take 1 tablet by mouth daily.   levothyroxine  (SYNTHROID ) 112 MCG tablet Take 1 tablet (112 mcg total) by mouth daily before breakfast.   [DISCONTINUED] levothyroxine  (SYNTHROID ) 125 MCG tablet TAKE 1 TABLET(125 MCG) BY MOUTH DAILY BEFORE BREAKFAST   No facility-administered encounter medications on file as of 04/21/2024.   ALLERGIES: No Known Allergies  VACCINATION STATUS:  There is no immunization history on file for this patient.  HPI Claudia Acevedo is 58 y.o. female who presents today with a medical history as above. she is being seen in follow-up after she was seen in consultation for multifocal papillary thyroid  cancer requested  by Claudia Carwin, MD.    See notes from last visit.  She is recovering from her recent thyroidectomy. -Fine-needle aspiration biopsy showed papillary thyroid  carcinoma category 5.  She underwent total thyroidectomy and deep neck dissection on left side On March 29, 2021 by Dr. Karis. Surgical sample showed left-sided thyroid  malignancy measuring 1.4 cm and 0.2 cm, positive margins, 2 out of 11 lymph nodes positive. Pathology classification  mpT1B,PN1b. -Subsequent to her surgery, in July 2022, she underwent Thyrogen  stimulated remnant ablation with only expected uptake in the neck, absent distant metastasis.  Her most recent June 25, 2022 previsit surveillance thyroid  ultrasound was consistent with surgical changes of total thyroidectomy without evidence of residual or recurrent thyroid  tissue.  She did have  indeterminate 6 mm lymph node  within the left thyroid  resection bed.   She was offered surveillance CT neck/chest during her last visit.   CT was negative for neck mass lesions, however showed a lytic lesion on T10 which needed further study.  After her last visit, she was sent for whole-body bone scan for better assessment-see below.   Patient denies dysphagia or shortness of breath, nor voice change.  She remains on levothyroxine  for postsurgical hypothyroidism.  She is currently on levothyroxine  125 mcg p.o. daily before breakfast.  Her previsit thyroid  function tests are indicative of slight over replacement-see below.     She reports optimal compliance and consistency with her medication.    She has well-controlled hyperlipidemia.  She had some thyroid  dysfunction in her mother.She denies any family history of thyroid  malignancy.   She did not have significant goiter prior to her surgery or diagnosed with thyroid  cancer.  She has previous lipoma surgery in 2014. She denies palpitations, tremors, heat intolerance.  She does have some mild degree of hot flashes.  She has recovered from thyroid  surgery very well.  She also underwent lipoma surgery in the interval.   Review of Systems   Objective:       04/21/2024    2:36 PM 03/30/2024    9:17 AM 09/02/2023    8:58 AM  Vitals with BMI  Height 5' 8 5' 8 5' 8  Weight 227 lbs 10 oz 227 lbs 10 oz 231 lbs 3 oz  BMI 34.61 34.61 35.16  Systolic 122 122 867  Diastolic 84 84 82  Pulse 84 72 72    BP 122/84   Pulse 84   Ht 5' 8 (1.727 m)   Wt 227 lb 9.6 oz (103.2 kg)   BMI 34.61 kg/m   Wt Readings from Last 3 Encounters:  04/21/24 227 lb 9.6 oz (103.2 kg)  03/30/24 227 lb 9.6 oz (103.2 kg)  09/02/23 231 lb 3.2 oz (104.9 kg)    Physical Exam  Constitutional:  Body mass index is 34.61 kg/m.,  not in acute distress, normal state of mind Eyes: PERRLA, EOMI, no exophthalmos ENT: moist mucous membranes, + well-healed surgical scar on anterior lower neck,  no  gross cervical lymphadenopathy -Status post lipoma resection from left supraclavicular area.   CMP ( most recent) CMP     Component Value Date/Time   NA 143 12/18/2021 0749   K 4.1 12/18/2021 0749   CL 103 12/18/2021 0749   CO2 26 12/18/2021 0749   GLUCOSE 90 12/18/2021 0749   GLUCOSE 102 (H) 02/28/2021 1147   BUN 18 12/18/2021 0749   CREATININE 1.12 (H) 12/18/2021 0749   CALCIUM  9.4 12/18/2021 0749   PROT 6.7 12/18/2021 0749  ALBUMIN 4.3 12/18/2021 0749   AST 31 12/18/2021 0749   ALT 36 (H) 12/18/2021 0749   ALKPHOS 114 12/18/2021 0749   BILITOT 0.4 12/18/2021 0749   GFRNONAA >60 02/28/2021 1147   GFRAA >60 11/06/2018 0200   Biopsy and surgical pathology findings are reviewed.  FINDINGS: Two small foci of mild to moderate uptake along the midline of the lower neck in the expected location of the thyroid  bed are identified corresponding to expected residual functioning thyroid  tissue. No signs of tracer avid nodal metastasis or distant metastatic disease.   IMPRESSION: Expected residual functioning thyroid  tissue within the thyroid  bed status post thyroidectomy. No signs nodal metastasis or distant metastatic disease.  Recent Results (from the past 2160 hours)  Thyroglobulin antibody     Status: None   Collection Time: 03/24/24  8:34 AM  Result Value Ref Range   Thyroglobulin Antibody <1.0 0.0 - 0.9 IU/mL    Comment: Thyroglobulin Antibody measured by [employer] Methodology It should be noted that the presence of thyroglobulin antibodies may not be pathogenic nor diagnostic, especially at very low levels. The assay manufacturer has found that four percent of individuals without evidence of thyroid  disease or autoimmunity will have positive TgAb levels up to 4 IU/mL.   Thyroglobulin Level     Status: None   Collection Time: 03/24/24  8:34 AM  Result Value Ref Range   Thyroglobulin (TG-RIA) <2.0 ng/mL    Comment: This test was developed and its performance  characteristics determined by [facility]. It has not been cleared or approved by the Food and Drug Administration. Reference Range: Pubertal Children and Adults: <40 According to the [facility] of Clinical Biochemistry, the reference interval for Thyroglobulin (TG) should be related to euthyroid patients and not for patients who underwent thyroidectomy.  TG reference intervals for these patients depend on the residual mass of the thyroid  tissue left after surgery.  Establishing a post-operative baseline is recommended.  The assay quantitation limit is 2.0 ng/mL.   TSH     Status: None   Collection Time: 04/15/24  7:55 AM  Result Value Ref Range   TSH 1.510 0.450 - 4.500 uIU/mL  T4, free     Status: Abnormal   Collection Time: 04/15/24  7:55 AM  Result Value Ref Range   Free T4 1.84 (H) 0.82 - 1.77 ng/dL    June 25, 2022 surveillance thyroid /neck ultrasound IMPRESSION: 1. Surgical changes of total thyroidectomy without evidence of residual or recurrent thyroid  tissue. 2. Visible but indeterminate 6 mm lymph node within the left thyroid  resection bed. Recommend attention on follow-up imaging.   Chest CT on January 22, 2024 IMPRESSION: 1. Status post thyroidectomy. No CT evidence for suspicious pulmonary nodule or abnormal adenopathy within the chest. 2. Scattered sclerotic foci within the spine, nonspecific but given history of malignancy, suggest correlation with bone scan. 3. 13 mm hypodensity within the anterior dome of the liver, not entirely consistent with simple cyst. Correlation with MRI may be obtained for further assessment.  CT soft tissue neck with contrast on January 22, 2024 IMPRESSION: 1. Satisfactory post treatment CT appearance of the Neck. Absent thyroid  and regressed/resolved left level 4 lymphadenopathy since 2022. 2. Partial right parotid gland atrophy since 2022. 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 3. Chest CT the same reported  separately.  Whole-body bone scan on April 07, 2024 IMPRESSION: 1. Nonspecific subtle focus of increased radiotracer uptake in the left posterior aspect of the T10 level, which could correspond to  sclerotic region seen on recent CT. No other abnormal radiotracer uptake within the remainder of the thoracolumbar spine correspond to the additional sclerotic foci. This remains nonspecific, though metastatic disease is felt less likely. 2. Degenerative activity of the bilateral knees, left greater than right. 3. Otherwise unremarkable bone scan.  Assessment & Plan:   1. Postsurgical hypothyroidism 2. Malignant neoplasm of thyroid  gland (HCC)  She is status post total thyroidectomy for recently diagnosed follicular variant papillary thyroid  cancer multifocal, locally invasive including metastases to lymph nodes 2 out of 11 in the left cervical region status post total thyroidectomy and left neck dissection.   Considering pathology classification mpT1B,PN1b, multifocal nature of malignancy, relative youth of the patient, putting her at intermediate risk of tumor recurrence, she was considered for I-131 thyroid  remnant ablation with posttherapy scan showing expected uptake in the neck, no distant metastasis on May 22, 2021.    June 25, 2022 thyroid  ultrasound showed surgical changes of total thyroidectomy without evidence of  residual or recurrent thyroid  tissue. Visible but indeterminate 6 mm lymph node within the left thyroid  resection bed.  Her subsequent CT chest showed abnormal lytic lesions on the spine.  She is offered radionuclide bone scan to assess better. Her previsit thyroglobulin's are still pending, does have undetectable thyroid  blood antibodies.  Her CT neck is negative for residual thyroid /neck disease recurrence. - Her previsit whole-body bone scan was not significant for suspicious metastatic disease, however it shows T10 lytic lesion.  She will be offered repeat surveillance  CT chest for better assessment before her next visit in 3 months. Her thyroglobulin was undetectable in May 2025.  Regarding postsurgical hypothyroidism: Her most recent thyroid  function test is consistent with slight over replacement.  I discussed and lowered her levothyroxine  to 112 mcg p.o. daily before breakfast.     - We discussed about the correct intake of her thyroid  hormone, on empty stomach at fasting, with water , separated by at least 30 minutes from breakfast and other medications,  and separated by more than 4 hours from calcium , iron, multivitamins, acid reflux medications (PPIs). -Patient is made aware of the fact that thyroid  hormone replacement is needed for life, dose to be adjusted by periodic monitoring of thyroid  function tests.   - she is advised to maintain close follow up with Claudia Carwin, MD for primary care needs.  I spent  25  minutes in the care of the patient today including review of labs from Thyroid  Function, CMP, and other relevant labs ; imaging/biopsy records (current and previous including abstractions from other facilities); face-to-face time discussing  her lab results and symptoms, medications doses, her options of short and long term treatment based on the latest standards of care / guidelines;   and documenting the encounter.  Chamika Cunanan  participated in the discussions, expressed understanding, and voiced agreement with the above plans.  All questions were answered to her satisfaction. she is encouraged to contact clinic should she have any questions or concerns prior to her return visit.  Follow up plan: Return in about 3 months (around 07/22/2024), or CT Chest with Contrast, for F/U with Pre-visit Labs.   Ranny Earl, MD [facility] [address] [place], [place] [address] Phone: [phone]  Fax: [phone]     04/21/2024, 6:55 PM  This note was partially dictated with voice recognition  software. Similar sounding words can be transcribed inadequately or may not  be corrected upon review.

## 2024-05-09 ENCOUNTER — Other Ambulatory Visit: Payer: Self-pay | Admitting: "Endocrinology

## 2024-06-08 ENCOUNTER — Other Ambulatory Visit: Payer: Self-pay | Admitting: Obstetrics and Gynecology

## 2024-06-08 DIAGNOSIS — R928 Other abnormal and inconclusive findings on diagnostic imaging of breast: Secondary | ICD-10-CM

## 2024-06-16 ENCOUNTER — Ambulatory Visit
Admission: RE | Admit: 2024-06-16 | Discharge: 2024-06-16 | Disposition: A | Discharge status: Home / Self Care | Source: Ambulatory Visit | Attending: Obstetrics and Gynecology | Admitting: Obstetrics and Gynecology

## 2024-06-16 ENCOUNTER — Ambulatory Visit

## 2024-06-16 DIAGNOSIS — R928 Other abnormal and inconclusive findings on diagnostic imaging of breast: Secondary | ICD-10-CM

## 2024-07-15 ENCOUNTER — Other Ambulatory Visit: Payer: Self-pay | Admitting: "Endocrinology

## 2024-07-15 DIAGNOSIS — C73 Malignant neoplasm of thyroid gland: Secondary | ICD-10-CM

## 2024-07-17 ENCOUNTER — Ambulatory Visit (HOSPITAL_COMMUNITY)
Admission: RE | Admit: 2024-07-17 | Discharge: 2024-07-17 | Disposition: A | Discharge status: Home / Self Care | Source: Ambulatory Visit | Attending: "Endocrinology | Admitting: "Endocrinology

## 2024-07-17 DIAGNOSIS — C73 Malignant neoplasm of thyroid gland: Secondary | ICD-10-CM | POA: Insufficient documentation

## 2024-07-17 MED ORDER — IOHEXOL 300 MG/ML  SOLN
75.0000 mL | Freq: Once | INTRAMUSCULAR | Status: AC | PRN
  Administered 2024-07-17: 75 mL via INTRAVENOUS

## 2024-07-24 ENCOUNTER — Encounter: Payer: Self-pay | Admitting: "Endocrinology

## 2024-07-24 ENCOUNTER — Ambulatory Visit: Admitting: "Endocrinology

## 2024-07-24 VITALS — BP 112/82 | HR 76 | Ht 68.0 in | Wt 231.0 lb

## 2024-07-24 DIAGNOSIS — E890 Postprocedural hypothyroidism: Secondary | ICD-10-CM | POA: Diagnosis not present

## 2024-07-24 DIAGNOSIS — C73 Malignant neoplasm of thyroid gland: Secondary | ICD-10-CM

## 2024-07-24 NOTE — Progress Notes (Signed)
 07/24/2024, 9:35 AM  Endocrinology follow-up note   Subjective:    Patient ID: Claudia Acevedo, female    DOB: 06-10-1966, PCP Claudia Carwin, MD   Past Medical History:  Diagnosis Date   GERD (gastroesophageal reflux disease)    Hyperlipidemia 10/2020   Thyroid  cancer (HCC)    Thyroid  disease    Past Surgical History:  Procedure Laterality Date   BREAST SURGERY  03/1986   lft fib removed from left breast   EYE SURGERY Bilateral 2000-2001   laser eye surgery for nearsightedness   LIPOMA EXCISION Left 10/19/2013   Procedure: EXCISION THIGH LIPOMA;  Surgeon: Claudia Acevedo Curvin DOUGLAS, MD;  Location: [facility];  Service: General;  Laterality: Left;   MASS EXCISION Right 11/09/2021   Procedure: EXCISION RIGHT CLAVICULAR MASS;  Surgeon: Claudia Estefana GORMAN, DO;  Location: [facility];  Service: Plastics;  Laterality: Right;   THYROIDECTOMY Left 03/29/2021   Procedure: TOTAL THYROIDECTOMY WITH LEFT NECK DISSECTION;  Surgeon: Claudia Clunes, MD;  Location: [facility];  Service: ENT;  Laterality: Left;   WISDOM TOOTH EXTRACTION  2004   Social History   Socioeconomic History   Marital status: Married    Spouse name: Not on file   Number of children: Not on file   Years of education: Not on file   Highest education level: Not on file  Occupational History   Not on file  Tobacco Use   Smoking status: Never   Smokeless tobacco: Never  Vaping Use   Vaping status: Never Used  Substance and Sexual Activity   Alcohol use: Yes    Comment: twice month   Drug use: No   Sexual activity: Yes    Birth control/protection: I.U.D.  Other Topics Concern   Not on file  Social History Narrative   Not on file   Social Drivers of Health   Financial Resource Strain: Not on file  Food Insecurity: Not on file  Transportation Needs: Not on file  Physical Activity: Not on file  Stress: Not on file  Social Connections: Unknown  (03/13/2022)   Received from [facility]   Social Network    Social Network: Not on file   Family History  Problem Relation Age of Onset   Cancer Mother    Thyroid  disease Mother    Hyperlipidemia Mother    Hypertension Mother    ALS Father    Outpatient Encounter Medications as of 07/24/2024  Medication Sig   atorvastatin  (LIPITOR) 20 MG tablet Take 20 mg by mouth at bedtime.   Lactobacillus (PROBIOTIC ACIDOPHILUS PO) Take 1 tablet by mouth daily.   levothyroxine  (SYNTHROID ) 112 MCG tablet Take 1 tablet (112 mcg total) by mouth daily before breakfast.   No facility-administered encounter medications on file as of 07/24/2024.   ALLERGIES: No Known Allergies  VACCINATION STATUS:  There is no immunization history on file for this patient.  HPI Claudia Acevedo is 58 y.o. female who presents today with a medical history as above. she is being seen in follow-up after she was seen in consultation for multifocal papillary thyroid  cancer requested by Claudia Carwin, MD.    See notes from last visit.   Biopsy of left  sided sentinel lymph node: - Fragments of papillary thyroid  carcinoma, hyalinized fibrous tissue  and skeletal muscle.  On Feb 28, 2021 she underwent biopsy of left cervical lymph node after concern for metastatic thyroid  cancer which showed fragments of papillary thyroid  carcinoma, hyalinized fibrous tissue and skeletal muscle.  -  She subsequently underwent total thyroidectomy and deep neck dissection on left side of her thyroid  on March 29, 2021 by Dr. Karis. Surgical sample showed left-sided thyroid  malignancy measuring 1.4 cm and 0.2 cm, positive margins, 2 out of 11 lymph nodes positive. Pathology classification  mpT1B,PN1b. -Subsequent to her surgery, in July 2022, she underwent Thyrogen  stimulated remnant ablation with only expected uptake in the neck, absent distant metastasis.  Her most recent June 25, 2022 previsit surveillance thyroid  ultrasound was consistent with  surgical changes of total thyroidectomy without evidence of residual or recurrent thyroid  tissue.  She did have  indeterminate 6 mm lymph node within the left thyroid  resection bed.   She was offered surveillance CT neck/chest during her last visit.   CT was negative for neck mass lesions, however showed a lytic lesion on T10 which needed further study.    - In June 2025,  she was sent for whole-body bone scan for better assessment-see below.  -A follow-up CT chest-stable findings compared to her previous CT in March 2024-see below  Patient denies dysphagia or shortness of breath, nor voice change.  She remains on levothyroxine  for postsurgical hypothyroidism.  She is currently on levothyroxine  112 mcg p.o. daily before breakfast.  Her previsit thyroid  function tests are consistent with appropriate replacement.     She reports optimal compliance and consistency with her medication.    She has well-controlled hyperlipidemia.  She had some thyroid  dysfunction in her mother.She denies any family history of thyroid  malignancy.   She did not have significant goiter prior to her surgery or diagnosed with thyroid  cancer.  She has previous lipoma surgery in 2014. She denies palpitations, tremors, heat intolerance.  She does have some mild degree of hot flashes.  She has recovered from thyroid  surgery very well.  She also underwent lipoma surgery in the interval.   Review of Systems   Objective:       07/24/2024    8:19 AM 04/21/2024    2:36 PM 03/30/2024    9:17 AM  Vitals with BMI  Height 5' 8 5' 8 5' 8  Weight 231 lbs 227 lbs 10 oz 227 lbs 10 oz  BMI 35.13 34.61 34.61  Systolic 112 122 877  Diastolic 82 84 84  Pulse 76 84 72    BP 112/82   Pulse 76   Ht 5' 8 (1.727 m)   Wt 231 lb (104.8 kg)   BMI 35.12 kg/m   Wt Readings from Last 3 Encounters:  07/24/24 231 lb (104.8 kg)  04/21/24 227 lb 9.6 oz (103.2 kg)  03/30/24 227 lb 9.6 oz (103.2 kg)    Physical  Exam  Constitutional:  Body mass index is 35.12 kg/m.,  not in acute distress, normal state of mind Eyes: PERRLA, EOMI, no exophthalmos ENT: moist mucous membranes, + well-healed surgical scar on anterior lower neck,  no gross cervical lymphadenopathy -Status post lipoma resection from left supraclavicular area.   CMP ( most recent) CMP     Component Value Date/Time   NA 143 12/18/2021 0749   K 4.1 12/18/2021 0749   CL 103 12/18/2021 0749   CO2 26 12/18/2021 0749   GLUCOSE 90 12/18/2021  0749   GLUCOSE 102 (H) 02/28/2021 1147   BUN 18 12/18/2021 0749   CREATININE 1.12 (H) 12/18/2021 0749   CALCIUM  9.4 12/18/2021 0749   PROT 6.7 12/18/2021 0749   ALBUMIN 4.3 12/18/2021 0749   AST 31 12/18/2021 0749   ALT 36 (H) 12/18/2021 0749   ALKPHOS 114 12/18/2021 0749   BILITOT 0.4 12/18/2021 0749   GFRNONAA >60 02/28/2021 1147   GFRAA >60 11/06/2018 0200   Biopsy and surgical pathology findings are reviewed.  FINDINGS: Two small foci of mild to moderate uptake along the midline of the lower neck in the expected location of the thyroid  bed are identified corresponding to expected residual functioning thyroid  tissue. No signs of tracer avid nodal metastasis or distant metastatic disease.   IMPRESSION: Expected residual functioning thyroid  tissue within the thyroid  bed status post thyroidectomy. No signs nodal metastasis or distant metastatic disease.  Recent Results (from the past 2160 hours)  TSH     Status: None   Collection Time: 07/17/24  7:55 AM  Result Value Ref Range   TSH 3.830 0.450 - 4.500 uIU/mL  T4, free     Status: None   Collection Time: 07/17/24  7:55 AM  Result Value Ref Range   Free T4 1.68 0.82 - 1.77 ng/dL  Thyroglobulin antibody     Status: None   Collection Time: 07/17/24  7:55 AM  Result Value Ref Range   Thyroglobulin Antibody <1.0 0.0 - 0.9 IU/mL    Comment: Thyroglobulin Antibody measured by [employer] Methodology It should be noted that the  presence of thyroglobulin antibodies may not be pathogenic nor diagnostic, especially at very low levels. The assay manufacturer has found that four percent of individuals without evidence of thyroid  disease or autoimmunity will have positive TgAb levels up to 4 IU/mL.   Thyroglobulin Level     Status: None (Preliminary result)   Collection Time: 07/17/24  7:55 AM  Result Value Ref Range   Thyroglobulin (TG-RIA) WILL FOLLOW     June 25, 2022 surveillance thyroid /neck ultrasound IMPRESSION: 1. Surgical changes of total thyroidectomy without evidence of residual or recurrent thyroid  tissue. 2. Visible but indeterminate 6 mm lymph node within the left thyroid  resection bed. Recommend attention on follow-up imaging.   Chest CT on January 22, 2024 IMPRESSION: 1. Status post thyroidectomy. No CT evidence for suspicious pulmonary nodule or abnormal adenopathy within the chest. 2. Scattered sclerotic foci within the spine, nonspecific but given history of malignancy, suggest correlation with bone scan. 3. 13 mm hypodensity within the anterior dome of the liver, not entirely consistent with simple cyst. Correlation with MRI may be obtained for further assessment.  CT soft tissue neck with contrast on January 22, 2024 IMPRESSION: 1. Satisfactory post treatment CT appearance of the Neck. Absent thyroid  and regressed/resolved left level 4 lymphadenopathy since 2022. 2. Partial right parotid gland atrophy since 2022. 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 3. Chest CT the same reported separately.  Whole-body bone scan on April 07, 2024 IMPRESSION: 1. Nonspecific subtle focus of increased radiotracer uptake in the left posterior aspect of the T10 level, which could correspond to sclerotic region seen on recent CT. No other abnormal radiotracer uptake within the remainder of the thoracolumbar spine correspond to the additional sclerotic foci. This remains nonspecific,  though metastatic disease is felt less likely. 2. Degenerative activity of the bilateral knees, left greater than right. 3. Otherwise unremarkable bone scan.  Repeat CT chest with contrast on July 17, 2024:  IMPRESSION: 1. There are several irregular sclerotic lesions in the thoracic spine which appear grossly similar to the prior study. No pathological fracture. No new sclerotic lesions seen. 2. No new or suspicious lung nodules. 3. Multiple other nonacute observations, as described above.  Assessment & Plan:   1. Postsurgical hypothyroidism 2. Malignant neoplasm of thyroid  gland (HCC)  She is status post total thyroidectomy for recently diagnosed follicular variant papillary thyroid  cancer multifocal, locally invasive including metastases to lymph nodes 2 out of 11 in the left cervical region status post total thyroidectomy and left neck dissection.   Considering pathology classification mpT1B,PN1b, multifocal nature of malignancy, relative youth of the patient, putting her at intermediate risk of tumor recurrence, she was considered for I-131 thyroid  remnant ablation with posttherapy scan showing expected uptake in the neck, no distant metastasis on May 22, 2021.    June 25, 2022 thyroid  ultrasound showed surgical changes of total thyroidectomy without evidence of  residual or recurrent thyroid  tissue. Visible but indeterminate 6 mm lymph node within the left thyroid  resection bed.  Her subsequent CT chest showed abnormal lytic lesions on the spine.  Her CT neck is negative for residual thyroid /neck disease recurrence.She is offered radionuclide bone scan to assess better.  Finding with nonspecific subtle focus of increased radiotracer uptake at the level of T10. Repeat CT on July 13, 2024 similar to PET/CT in March 2025.  Several irregular sclerotic lesions in the thoracic spine which appeared grossly similar to the prior study.  No pathological fracture.  No new sclerotic  lesion.  No new or suspicious lung nodules.    Her previsit thyroglobulin's are still pending, continues to have undetectable thyroglobulin antibodies.    -She will be put on expectant management with plan to repeat whole-body scan with Thyrogen  in a year.  Regarding postsurgical hypothyroidism: Her most recent thyroid  function test is consistent with appropriate replacement.  She is advised to continue levothyroxine  112 mcg p.o. daily before breakfast.     - We discussed about the correct intake of her thyroid  hormone, on empty stomach at fasting, with water , separated by at least 30 minutes from breakfast and other medications,  and separated by more than 4 hours from calcium , iron, multivitamins, acid reflux medications (PPIs). -Patient is made aware of the fact that thyroid  hormone replacement is needed for life, dose to be adjusted by periodic monitoring of thyroid  function tests.   - she is advised to maintain close follow up with Claudia Carwin, MD for primary care needs.   I spent  22  minutes in the care of the patient today including review of labs from Thyroid  Function, CMP, and other relevant labs ; imaging/biopsy records (current and previous including abstractions from other facilities); face-to-face time discussing  her lab results and symptoms, medications doses, her options of short and long term treatment based on the latest standards of care / guidelines;   and documenting the encounter.  Haleema Vanderheyden  participated in the discussions, expressed understanding, and voiced agreement with the above plans.  All questions were answered to her satisfaction. she is encouraged to contact clinic should she have any questions or concerns prior to her return visit.   Follow up plan: Return in about 6 months (around 01/21/2025) for F/U with Pre-visit Labs.   Ranny Earl, MD [facility] [address] [place], [place] [address] Phone:  [phone]  Fax: [phone]     07/24/2024, 9:35 AM  This note was  partially dictated with voice recognition software. Similar sounding words can be transcribed inadequately or may not  be corrected upon review.

## 2024-07-30 LAB — THYROGLOBULIN ANTIBODY: Thyroglobulin Antibody: <1 [IU]/mL (ref 0.0–0.9)

## 2024-07-30 LAB — THYROGLOBULIN LEVEL: Thyroglobulin (TG-RIA): <2 ng/mL

## 2024-07-30 LAB — T4, FREE: Free T4: 1.68 ng/dL (ref 0.82–1.77)

## 2024-07-30 LAB — TSH: TSH: 3.83 u[IU]/mL (ref 0.450–4.500)

## 2024-10-16 ENCOUNTER — Other Ambulatory Visit: Payer: Self-pay | Admitting: "Endocrinology

## 2024-12-01 ENCOUNTER — Other Ambulatory Visit (HOSPITAL_COMMUNITY): Payer: Self-pay | Admitting: Internal Medicine

## 2024-12-01 DIAGNOSIS — N1831 Chronic kidney disease, stage 3a: Secondary | ICD-10-CM

## 2024-12-08 ENCOUNTER — Ambulatory Visit (HOSPITAL_COMMUNITY)

## 2025-01-25 ENCOUNTER — Ambulatory Visit: Admitting: "Endocrinology
